# Patient Record
Sex: Female | Born: 2000 | Race: Black or African American | Hispanic: Yes | Marital: Single | Smoking: Never smoker
Health system: Southern US, Community
[De-identification: ages and names within clinical notes are randomized; demographics above are authoritative.]

## PROBLEM LIST (undated history)

## (undated) DIAGNOSIS — R011 Cardiac murmur, unspecified: Secondary | ICD-10-CM

## (undated) DIAGNOSIS — I10 Essential (primary) hypertension: Secondary | ICD-10-CM

## (undated) HISTORY — PX: WISDOM TOOTH EXTRACTION: SHX21

## (undated) HISTORY — PX: OTHER SURGICAL HISTORY: SHX169

---
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
## 2019-10-27 ENCOUNTER — Ambulatory Visit: Payer: BC Managed Care – PPO | Attending: Internal Medicine

## 2019-10-27 ENCOUNTER — Other Ambulatory Visit: Payer: Self-pay

## 2019-10-27 DIAGNOSIS — Z20822 Contact with and (suspected) exposure to covid-19: Secondary | ICD-10-CM
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
## 2019-10-30 ENCOUNTER — Telehealth: Payer: Self-pay

## 2019-10-30 LAB — NOVEL CORONAVIRUS, NAA: SARS-CoV-2, NAA: NOT DETECTED

## 2019-10-30 NOTE — Telephone Encounter (Signed)
Patient needs her negative COVID-19 result for school but realized it does not have her name when she views in My Chart.  She was informed that we can fax to her school.  She will check with school to make sure they will accept via fax and call back.

## 2020-01-10 ENCOUNTER — Ambulatory Visit: Payer: BC Managed Care – PPO | Attending: Internal Medicine

## 2020-01-10 DIAGNOSIS — Z20822 Contact with and (suspected) exposure to covid-19: Secondary | ICD-10-CM

## 2020-01-11 LAB — SARS-COV-2, NAA 2 DAY TAT

## 2020-01-11 LAB — NOVEL CORONAVIRUS, NAA: SARS-CoV-2, NAA: NOT DETECTED

## 2020-12-10 LAB — LAB REPORT - SCANNED: Chlamydia, Swab/Urine, PCR: NEGATIVE

## 2021-03-01 ENCOUNTER — Ambulatory Visit
Admission: EM | Admit: 2021-03-01 | Discharge: 2021-03-01 | Disposition: A | Discharge status: Home / Self Care | Payer: BC Managed Care – PPO | Attending: Emergency Medicine | Admitting: Emergency Medicine

## 2021-03-01 ENCOUNTER — Other Ambulatory Visit: Payer: Self-pay

## 2021-03-01 ENCOUNTER — Encounter: Payer: Self-pay | Admitting: Emergency Medicine

## 2021-03-01 DIAGNOSIS — J069 Acute upper respiratory infection, unspecified: Secondary | ICD-10-CM

## 2021-03-01 DIAGNOSIS — Z1152 Encounter for screening for COVID-19: Secondary | ICD-10-CM | POA: Diagnosis not present

## 2021-03-01 MED ORDER — IBUPROFEN 800 MG PO TABS: 800.0000 mg | ORAL_TABLET | Freq: Three times a day (TID) | ORAL | 0 refills | Status: DC

## 2021-03-01 MED ORDER — CETIRIZINE HCL 10 MG PO CAPS: 10.0000 mg | ORAL_CAPSULE | Freq: Every day | ORAL | 0 refills | Status: DC

## 2021-03-01 MED ORDER — BENZONATATE 200 MG PO CAPS: 200.0000 mg | ORAL_CAPSULE | Freq: Three times a day (TID) | ORAL | 0 refills | Status: AC | PRN

## 2021-03-01 NOTE — ED Triage Notes (Signed)
Pt said x 4 days has been having nasal congestion, with itchy throat with no appetite and body aches with central chest pain that goes up into he left breast, pain is more with deep breaths and sometimes come and goes. No fevers, no chills

## 2021-03-01 NOTE — Discharge Instructions (Signed)
COVID test pending, monitor MyChart for results Tylenol and ibuprofen for fevers, sore throat, body aches and chest discomfort Rest and fluids Tessalon every 8 hours for cough Begin daily cetirizine/Zyrtec to help with postnasal drainage/throat irritation May continue to use over-the-counter medicine if preferred Follow-up if not improving or worsening

## 2021-03-01 NOTE — ED Provider Notes (Signed)
[facility]    CSN: [id number] Arrival date & time: 03/01/21  1047      History   Chief Complaint Chief Complaint  Patient presents with  . Nasal Congestion  . Sore Throat  . Generalized Body Aches    HPI Katherine Shaffer is a 20 y.o. female presenting today for evaluation of URI symptoms.  Reports over the past 4 days has had congestion, throat irritation as well as some body aches and decreased appetite.  Denies central chest discomfort into the left side and under left breast.  Worse with deep breaths.  Reports pain is intermittent.  Denies any fevers or chills  HPI  Past Medical History:  Diagnosis Date  . Heart murmur     There are no problems to display for this patient.   History reviewed. No pertinent surgical history.  OB History   No obstetric history on file.      Home Medications    Prior to Admission medications   Medication Sig Start Date End Date Taking? Authorizing Provider  benzonatate (TESSALON) 200 MG capsule Take 1 capsule (200 mg total) by mouth 3 (three) times daily as needed for up to 7 days for cough. 03/01/21 03/08/21 Yes Malin Cervini C, PA-C  Cetirizine HCl 10 MG CAPS Take 1 capsule (10 mg total) by mouth daily for 10 days. 03/01/21 03/11/21 Yes Lorien Shingler C, PA-C  ibuprofen (ADVIL) 800 MG tablet Take 1 tablet (800 mg total) by mouth 3 (three) times daily. 03/01/21  Yes Cataleia Gade C, PA-C  amoxicillin (AMOXIL) 500 MG capsule Take 2 capsules (1,000 mg total) by mouth 2 (two) times daily. 11/28/17   Ofilia Neas, PA-C  norgestimate-ethinyl estradiol (ORTHO-CYCLEN,SPRINTEC,PREVIFEM) 0.25-35 MG-MCG tablet Take 1 tablet by mouth daily.    [provider]  traMADol (ULTRAM) 50 MG tablet Take 1 tablet (50 mg total) by mouth every 6 (six) hours as needed for severe pain. 11/12/18   Charlestine Night, PA-C    Family History Family History  Problem Relation Age of Onset  . Hypertension Mother     Social  History Social History   Tobacco Use  . Smoking status: Never Smoker  Substance Use Topics  . Alcohol use: No  . Drug use: No     Allergies   Patient has no known allergies.   Review of Systems Review of Systems  Constitutional: Negative for activity change, appetite change, chills, fatigue and fever.  HENT: Positive for congestion, rhinorrhea, sinus pressure and sore throat. Negative for ear pain and trouble swallowing.   Eyes: Negative for discharge and redness.  Respiratory: Positive for cough and chest tightness. Negative for shortness of breath.   Cardiovascular: Negative for chest pain.  Gastrointestinal: Negative for abdominal pain, diarrhea, nausea and vomiting.  Musculoskeletal: Negative for myalgias.  Skin: Negative for rash.  Neurological: Positive for headaches. Negative for dizziness and light-headedness.     Physical Exam Triage Vital Signs ED Triage Vitals  Enc Vitals Group     BP 03/01/21 1312 124/76     Pulse Rate 03/01/21 1312 88     Resp 03/01/21 1312 16     Temp 03/01/21 1312 98.9 F (37.2 C)     Temp Source 03/01/21 1312 Oral     SpO2 03/01/21 1312 97 %     Weight --      Height --      Head Circumference --      Peak Flow --  Pain Score 03/01/21 1310 2     Pain Loc --      Pain Edu? --      Excl. in GC? --    No data found.  Updated Vital Signs BP 124/76 (BP Location: Right Arm)   Pulse 88   Temp 98.9 F (37.2 C) (Oral)   Resp 16   SpO2 97%   Visual Acuity Right Eye Distance:   Left Eye Distance:   Bilateral Distance:    Right Eye Near:   Left Eye Near:    Bilateral Near:     Physical Exam Vitals and nursing note reviewed.  Constitutional:      Appearance: She is well-developed.     Comments: No acute distress  HENT:     Head: Normocephalic and atraumatic.     Ears:     Comments: Bilateral ears without tenderness to palpation of external auricle, tragus and mastoid, EAC's without erythema or swelling, TM's with good  bony landmarks and cone of light. Non erythematous.     Nose: Nose normal.     Mouth/Throat:     Comments: Oral mucosa pink and moist, no tonsillar enlargement or exudate. Posterior pharynx patent and nonerythematous, no uvula deviation or swelling. Normal phonation. Eyes:     Conjunctiva/sclera: Conjunctivae normal.  Cardiovascular:     Rate and Rhythm: Normal rate.  Pulmonary:     Effort: Pulmonary effort is normal. No respiratory distress.     Comments: Breathing comfortably at rest, CTABL, no wheezing, rales or other adventitious sounds auscultated Abdominal:     General: There is no distension.  Musculoskeletal:        General: Normal range of motion.     Cervical back: Neck supple.  Skin:    General: Skin is warm and dry.  Neurological:     Mental Status: She is alert and oriented to person, place, and time.      UC Treatments / Results  Labs (all labs ordered are listed, but only abnormal results are displayed) Labs Reviewed  NOVEL CORONAVIRUS, NAA    EKG   Radiology No results found.  Procedures Procedures (including critical care time)  Medications Ordered in UC Medications - No data to display  Initial Impression / Assessment and Plan / UC Course  I have reviewed the triage vital signs and the nursing notes.  Pertinent labs & imaging results that were available during my care of the patient were reviewed by me and considered in my medical decision making (see chart for details).     Viral URI with cough-COVID test pending, exam reassuring, recommending symptomatic and supportive care rest and fluids.  Discussed strict return precautions. Patient verbalized understanding and is agreeable with plan.  Final Clinical Impressions(s) / UC Diagnoses   Final diagnoses:  Encounter for screening for COVID-19  Viral URI with cough     Discharge Instructions     COVID test pending, monitor MyChart for results Tylenol and ibuprofen for fevers, sore  throat, body aches and chest discomfort Rest and fluids Tessalon every 8 hours for cough Begin daily cetirizine/Zyrtec to help with postnasal drainage/throat irritation May continue to use over-the-counter medicine if preferred Follow-up if not improving or worsening    ED Prescriptions    Medication Sig Dispense Auth. Provider   ibuprofen (ADVIL) 800 MG tablet Take 1 tablet (800 mg total) by mouth 3 (three) times daily. 21 tablet Yalitza Teed C, PA-C   Cetirizine HCl 10 MG CAPS Take 1 capsule (  10 mg total) by mouth daily for 10 days. 10 capsule Leigha Olberding C, PA-C   benzonatate (TESSALON) 200 MG capsule Take 1 capsule (200 mg total) by mouth 3 (three) times daily as needed for up to 7 days for cough. 28 capsule Cherilyn Sautter, [place] C, PA-C     PDMP not reviewed this encounter.   Lew Dawes, PA-C 03/01/21 1443

## 2021-03-03 LAB — SARS-COV-2, NAA 2 DAY TAT

## 2021-03-03 LAB — NOVEL CORONAVIRUS, NAA: SARS-CoV-2, NAA: NOT DETECTED

## 2022-04-29 ENCOUNTER — Ambulatory Visit (INDEPENDENT_AMBULATORY_CARE_PROVIDER_SITE_OTHER): Payer: 59 | Admitting: Podiatry

## 2022-04-29 ENCOUNTER — Ambulatory Visit (INDEPENDENT_AMBULATORY_CARE_PROVIDER_SITE_OTHER): Payer: 59

## 2022-04-29 ENCOUNTER — Encounter: Payer: Self-pay | Admitting: Podiatry

## 2022-04-29 ENCOUNTER — Ambulatory Visit: Payer: 59

## 2022-04-29 DIAGNOSIS — M2042 Other hammer toe(s) (acquired), left foot: Secondary | ICD-10-CM

## 2022-04-29 DIAGNOSIS — M2041 Other hammer toe(s) (acquired), right foot: Secondary | ICD-10-CM

## 2022-04-29 NOTE — Progress Notes (Signed)
  Subjective:  Patient ID: Katherine Shaffer, female    DOB: Aug 13, 2001,   MRN: [id number]  Chief Complaint  Patient presents with   Hammer Toe    bil foot pain/ possilbe hammertoe/bunion     21 y.o. female presents for concern of bilateral second toe pain Relates throbbing pain in the toe. Has tried wearing larger shoes without relief. She would like to prevent the toe from shortening and hammering. Relates the pain does vary. She is not diabetic.  Marland Kitchen Denies any other pedal complaints. Denies n/v/f/c.   Past Medical History:  Diagnosis Date   Heart murmur     Objective:  Physical Exam: Vascular: DP/PT pulses 2/4 bilateral. CFT <3 seconds. Normal hair growth on digits. No edema.  Skin. No lacerations or abrasions bilateral feet.  Musculoskeletal: MMT 5/5 bilateral lower extremities in DF, PF, Inversion and Eversion. Deceased ROM in DF of ankle joint.  Elongated second digit bilateral with some hammered at the PIPJ but mostly straight and flexible.  Neurological: Sensation intact to light touch.   Assessment:   1. Pain in both feet      Plan:  Patient was evaluated and treated and all questions answered. -X-rays reviewed. Second digit elongated mostly from the proximal phalanx.  -Educated on hammertoes and treatment options  -Discussed padding including toe caps and crest pads.  -Discussed need for potential surgery if pain does not improved.  -Patient to follow-up as needed. Discussed calling if any changes or increased pain.    Louann Sjogren, DPM

## 2022-05-27 ENCOUNTER — Encounter: Payer: Self-pay | Admitting: Nurse Practitioner

## 2022-05-27 ENCOUNTER — Ambulatory Visit (INDEPENDENT_AMBULATORY_CARE_PROVIDER_SITE_OTHER): Payer: 59 | Admitting: Nurse Practitioner

## 2022-05-27 VITALS — BP 118/80 | HR 86 | Temp 97.4°F | Ht 64.0 in | Wt 157.6 lb

## 2022-05-27 DIAGNOSIS — B354 Tinea corporis: Secondary | ICD-10-CM

## 2022-05-27 DIAGNOSIS — Z0001 Encounter for general adult medical examination with abnormal findings: Secondary | ICD-10-CM

## 2022-05-27 DIAGNOSIS — Z23 Encounter for immunization: Secondary | ICD-10-CM | POA: Diagnosis not present

## 2022-05-27 DIAGNOSIS — Z111 Encounter for screening for respiratory tuberculosis: Secondary | ICD-10-CM

## 2022-05-27 LAB — COMPREHENSIVE METABOLIC PANEL
ALT: 14 U/L (ref 0–35)
AST: 18 U/L (ref 0–37)
Albumin: 4.5 g/dL (ref 3.5–5.2)
Alkaline Phosphatase: 60 U/L (ref 39–117)
BUN: 14 mg/dL (ref 6–23)
CO2: 23 mEq/L (ref 19–32)
Calcium: 9.4 mg/dL (ref 8.4–10.5)
Chloride: 102 mEq/L (ref 96–112)
Creatinine, Ser: 0.81 mg/dL (ref 0.40–1.20)
GFR: 103.22 mL/min (ref >60.00)
Glucose, Bld: 77 mg/dL (ref 70–99)
Potassium: 4 mEq/L (ref 3.5–5.1)
Sodium: 139 mEq/L (ref 135–145)
Total Bilirubin: 0.4 mg/dL (ref 0.2–1.2)
Total Protein: 7.7 g/dL (ref 6.0–8.3)

## 2022-05-27 LAB — CBC
HCT: 37.6 % (ref 36.0–46.0)
Hemoglobin: 12.6 g/dL (ref 12.0–15.0)
MCHC: 33.4 g/dL (ref 30.0–36.0)
MCV: 85.7 fl (ref 78.0–100.0)
Platelets: 361 10*3/uL (ref 150.0–400.0)
RBC: 4.39 Mil/uL (ref 3.87–5.11)
RDW: 13.8 % (ref 11.5–15.5)
WBC: 9.7 10*3/uL (ref 4.0–10.5)

## 2022-05-27 LAB — TSH: TSH: 1.19 u[IU]/mL (ref 0.35–5.50)

## 2022-05-27 MED ORDER — KETOCONAZOLE 2 % EX CREA: 1.0000 | TOPICAL_CREAM | Freq: Every day | CUTANEOUS | 0 refills | Status: DC

## 2022-05-27 NOTE — Patient Instructions (Addendum)
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  Schedule appt with GYN for breast and pelvic exam. Go to lab Thank you for choosing [facility].  Body Ringworm Body ringworm is an infection of the skin that often causes a ring-shaped rash. Body ringworm is also called tinea corporis. Body ringworm can affect any part of your skin. This condition is easily spread from person to person (is very contagious). What are the causes? This condition is caused by fungi called dermatophytes. The condition develops when these fungi grow out of control on the skin. You can get this condition if you touch a person or animal that has it. You can also get it if you share any items with an infected person or pet. These include: Clothing, bedding, and towels. Brushes or combs. Gym equipment. Any other object that has the fungus on it. What increases the risk? You are more likely to develop this condition if you: Play sports that involve close physical contact, such as wrestling. Sweat a lot. Live in areas that are hot and humid. Use public showers. Have a weakened immune system. What are the signs or symptoms? Symptoms of this condition include: Itchy, raised red spots and bumps. Red scaly patches. A ring-shaped rash. The rash may have: A clear center. Scales or red bumps at its center. Redness near its borders. Dry and scaly skin on or around it. How is this diagnosed? This condition can usually be diagnosed with a skin exam. A skin scraping may be taken from the affected area and examined under a microscope to see if the fungus is present. How is this treated? This condition may be treated with: An antifungal cream or ointment. An antifungal shampoo. Antifungal medicines. These may be prescribed if your ringworm: Is severe. Keeps coming back. Lasts a long time. Follow these instructions at home: Take over-the-counter and  prescription medicines only as told by your health care provider. If you were given an antifungal cream or ointment: Use it as told by your health care provider. Wash the infected area and dry it completely before applying the cream or ointment. If you were given an antifungal shampoo: Use it as told by your health care provider. Leave the shampoo on your body for 3-5 minutes before rinsing. While you have a rash: Wear loose clothing to stop clothes from rubbing and irritating it. Wash or change your bed sheets every night. Disinfect or throw out items that may be infected. Wash clothes and bed sheets in hot water. Wash your hands often with soap and water. If soap and water are not available, use hand sanitizer. If your pet has the same infection, take your pet to see a veterinarian for treatment. How is this prevented? Take a bath or shower every day and after every time you work out or play sports. Dry your skin completely after bathing. Wear sandals or shoes in public places and showers. Change your clothes every day. Wash athletic clothes after each use. Do not share personal items with others. Avoid touching red patches of skin on other people. Avoid touching pets that have bald spots. If you touch an animal that has a bald spot, wash your hands. Contact a health care provider if: Your rash continues to spread after 7 days of treatment. Your rash is not gone in 4 weeks. The area around your rash gets red, warm, tender, and swollen. Summary Body ringworm is an infection of the skin  that often causes a ring-shaped rash. This condition is easily spread from person to person (is very contagious). This condition may be treated with antifungal cream or ointment, antifungal shampoo, or antifungal medicines. Take over-the-counter and prescription medicines only as told by your health care provider. This information is not intended to replace advice given to you by your health care  provider. Make sure you discuss any questions you have with your health care provider. Document Revised: 12/17/2021 Document Reviewed: 07/29/2021 Elsevier Patient Education  2023 [employer].

## 2022-05-27 NOTE — Progress Notes (Signed)
Complete physical exam  Patient: Katherine Shaffer   DOB: 2001-10-10   21 y.o. Female  MRN: [id number] Visit Date: 05/27/2022  Subjective:    Chief Complaint  Patient presents with   Establish Care    New Pt, est care  Needs form filled out for work , pt not fasting Possible referral for rash on left side of hip No other concerns  HPV & tdap given today    Katherine Shaffer is a 21 y.o. female who presents today for a complete physical exam. She reports consuming a general diet.  none  She generally feels well. She reports sleeping well. She does have additional problems to discuss today.  Vision:Yes Dental:Within Last 6 months STD Screen:No  GYN: [facility]. Will schedule No contraception.  Most recent fall risk assessment:     No data to display           Most recent depression screenings:    05/27/2022   11:40 AM  PHQ 2/9 Scores  PHQ - 2 Score 0  PHQ- 9 Score 5   Rash This is a new problem. The current episode started more than 1 month ago. The problem is unchanged. The affected locations include the abdomen. The rash is characterized by dryness and itchiness. She was exposed to nothing. Pertinent negatives include no congestion, cough, diarrhea, fatigue, fever, joint pain, shortness of breath or sore throat. Past treatments include anti-itch cream (clotrimazole). The treatment provided mild relief. There is no history of eczema or varicella.    Past Medical History:  Diagnosis Date   Heart murmur    History reviewed. No pertinent surgical history. Social History   Socioeconomic History   Marital status: Single    Spouse name: Not on file   Number of children: Not on file   Years of education: Not on file   Highest education level: Not on file  Occupational History   Not on file  Tobacco Use   Smoking status: Never   Smokeless tobacco: Not on file  Vaping Use   Vaping Use: Never used  Substance and Sexual Activity   Alcohol use: No    Drug use: No   Sexual activity: Yes    Birth control/protection: None  Other Topics Concern   Not on file  Social History Narrative   Not on file   Social Determinants of Health   Financial Resource Strain: Not on file  Food Insecurity: Not on file  Transportation Needs: Not on file  Physical Activity: Not on file  Stress: Not on file  Social Connections: Not on file  Intimate Partner Violence: Not on file   Family Status  Relation Name Status   Mother  Alive   Father  Alive   MGM  (Not Specified)   MGF  Deceased   PGM  Alive   Family History  Problem Relation Age of Onset   Hypertension Mother    Glaucoma Mother    Diabetes Maternal Grandmother    Stroke Maternal Grandfather 50   Dementia Maternal Grandfather    Glaucoma Paternal Grandmother    No Known Allergies  Patient Care Team: Bronislaw Switzer, Bonna Gains, NP as PCP - [facility], [facility]   Medications: Outpatient Medications Prior to Visit  Medication Sig   [DISCONTINUED] amoxicillin (AMOXIL) 500 MG capsule Take 2 capsules (1,000 mg total) by mouth 2 (two) times daily.   [DISCONTINUED] Cetirizine HCl 10 MG CAPS Take 1 capsule (10 mg total)  by mouth daily for 10 days.   [DISCONTINUED] ibuprofen (ADVIL) 800 MG tablet Take 1 tablet (800 mg total) by mouth 3 (three) times daily.   [DISCONTINUED] norgestimate-ethinyl estradiol (ORTHO-CYCLEN,SPRINTEC,PREVIFEM) 0.25-35 MG-MCG tablet Take 1 tablet by mouth daily.   [DISCONTINUED] traMADol (ULTRAM) 50 MG tablet Take 1 tablet (50 mg total) by mouth every 6 (six) hours as needed for severe pain.   No facility-administered medications prior to visit.    Review of Systems  Constitutional:  Negative for fatigue and fever.  HENT:  Negative for congestion and sore throat.   Eyes:        Negative for visual changes  Respiratory:  Negative for cough and shortness of breath.   Cardiovascular:  Negative for chest pain, palpitations and leg  swelling.  Gastrointestinal:  Negative for blood in stool, constipation and diarrhea.  Genitourinary:  Negative for dysuria, frequency and urgency.  Musculoskeletal:  Negative for joint pain and myalgias.  Skin:  Positive for rash.  Neurological:  Negative for dizziness and headaches.  Hematological:  Does not bruise/bleed easily.  Psychiatric/Behavioral:  Negative for suicidal ideas. The patient is not nervous/anxious.         Objective:  BP 118/80 (BP Location: Right Arm, Patient Position: Sitting, Cuff Size: Small)   Pulse 86   Temp (!) 97.4 F (36.3 C) (Temporal)   Ht 5\' 4"  (1.626 m)   Wt 157 lb 9.6 oz (71.5 kg)   SpO2 99%   BMI 27.05 kg/m       Physical Exam Constitutional:      General: She is not in acute distress. HENT:     Right Ear: Tympanic membrane, ear canal and external ear normal.     Left Ear: Tympanic membrane, ear canal and external ear normal.     Nose: Nose normal.  Eyes:     General: No scleral icterus.    Extraocular Movements: Extraocular movements intact.     Conjunctiva/sclera: Conjunctivae normal.  Cardiovascular:     Rate and Rhythm: Normal rate and regular rhythm.     Pulses: Normal pulses.     Heart sounds: Normal heart sounds.  Pulmonary:     Effort: Pulmonary effort is normal. No respiratory distress.     Breath sounds: Normal breath sounds.  Abdominal:     General: Bowel sounds are normal. There is no distension.     Palpations: Abdomen is soft.  Genitourinary:    Comments: Deferred breast and pelvic exam to GYN per patient Musculoskeletal:        General: Normal range of motion.     Cervical back: Normal range of motion and neck supple.  Lymphadenopathy:     Cervical: No cervical adenopathy.  Skin:    General: Skin is warm and dry.     Findings: Rash present. No erythema. Rash is macular.       Neurological:     Mental Status: She is alert and oriented to person, place, and time.  Psychiatric:        Mood and Affect: Mood  normal.        Behavior: Behavior normal.        Thought Content: Thought content normal.     No results found for any visits on 05/27/22.    Assessment & Plan:    Routine Health Maintenance and Physical Exam  Immunization History  Administered Date(s) Administered   HPV 9-valent 05/27/2022   Tdap 05/27/2022   Health Maintenance  Topic Date Due  HIV Screening  Never done   Hepatitis C Screening  Never done   PAP-Cervical Cytology Screening  Never done   PAP SMEAR-Modifier  Never done   INFLUENZA VACCINE  05/19/2022   HPV VACCINES (2 - 3-dose series) 06/24/2022   TETANUS/TDAP  05/27/2032   Discussed health benefits of physical activity, and encouraged her to engage in regular exercise appropriate for her age and condition.  Problem List Items Addressed This Visit   None Visit Diagnoses     Encounter for preventative adult health care exam with abnormal findings    -  Primary   Relevant Orders   CBC   Comprehensive metabolic panel   TSH   Tinea corporis       Relevant Medications   ketoconazole (NIZORAL) 2 % cream   Screening-pulmonary TB       Relevant Orders   QuantiFERON-TB Gold Plus   Need for Tdap vaccination       Relevant Orders   Tdap vaccine greater than or equal to 7yo IM (Completed)   Need for HPV vaccination       Relevant Orders   HPV 9-valent vaccine,Recombinat (Completed)      Return in about 1 year (around 05/28/2023) for CPE (fasting).     Alysia Penna, NP

## 2022-05-29 LAB — QUANTIFERON-TB GOLD PLUS
Mitogen-NIL: >10 IU/mL
NIL: 0.1 IU/mL
QuantiFERON-TB Gold Plus: NEGATIVE
TB1-NIL: 0.08 IU/mL
TB2-NIL: 0.01 IU/mL

## 2022-06-11 ENCOUNTER — Ambulatory Visit (INDEPENDENT_AMBULATORY_CARE_PROVIDER_SITE_OTHER): Payer: 59 | Admitting: Family Medicine

## 2022-06-11 ENCOUNTER — Other Ambulatory Visit: Payer: Self-pay

## 2022-06-11 DIAGNOSIS — R109 Unspecified abdominal pain: Secondary | ICD-10-CM

## 2022-06-11 DIAGNOSIS — B49 Unspecified mycosis: Secondary | ICD-10-CM | POA: Diagnosis not present

## 2022-06-11 DIAGNOSIS — O26899 Other specified pregnancy related conditions, unspecified trimester: Secondary | ICD-10-CM

## 2022-06-11 DIAGNOSIS — N912 Amenorrhea, unspecified: Secondary | ICD-10-CM

## 2022-06-11 LAB — POCT PREGNANCY, URINE: Preg Test, Ur: POSITIVE — AB

## 2022-06-11 NOTE — Patient Instructions (Signed)
Prenatal Care Providers           [facility]  [address] [phone]  [facility]   [address]  [phone]  [facility] [place]       [address] [phone]            [facility] [place]     [address] [phone]          [facility] [place]   [address] [phone]  [facility]  [address] [phone]     [facility]  [address]   [phone]     [facility]  Phone: [phone]  [facility]  Phone: [phone]  [facility] Phone: [phone]  [facility] Phone: [phone]  [facility] Phone: [phone]  [facility] Phone: [phone]  [facility]  Phone: [phone]  

## 2022-06-11 NOTE — Progress Notes (Signed)
  Amenorrhea with positive UPT PROBLEM  VISIT ENCOUNTER NOTE  Subjective:   Katherine Shaffer is a 21 y.o. [place] female here for positive pregnancy test.  Patient is [redacted]w[redacted]d by LMP. She has not had serial bhcg. She has not had an [place]. She does report an abnormal period so dating is unsure.   Denies abnormal vaginal bleeding, discharge, pelvic pain, problems with intercourse or other gynecologic concerns.    Gynecologic History No LMP recorded. (Menstrual status: Oral contraceptives).  Health Maintenance Due  Topic Date Due   PAP-Cervical Cytology Screening  Never done   PAP SMEAR-Modifier  Never done   INFLUENZA VACCINE  05/19/2022   HPV VACCINES (2 - 3-dose series) 06/24/2022    The following portions of the patient's history were reviewed and updated as appropriate: allergies, current medications, past family history, past medical history, past social history, past surgical history and problem list.  Review of Systems Pertinent items are noted in HPI.   Objective:  There were no vitals taken for this visit. Gen: well appearing, NAD HEENT: no scleral icterus CV: RR Lung: Normal WOB Ext: warm well perfused   Assessment and Plan:   1. Amenorrhea UPT pos Plan for dating [place]  2. 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 Rip Harbour to continue topical treatment  3. Cramping complicating pregnancy, antepartum Reviewed normality of this complain Discussed use of tylenol if needed but if worsening to go to the hospital   Please refer to After Visit Summary for other counseling recommendations.   Return in about 4 weeks (around 07/09/2022) for New OB visit.  Future Appointments  Date Time Provider Department Center  06/23/2022 11:00 AM [facility] US2 [facility]     Federico Flake, MD, MPH, ABFM Attending Physician Faculty Practice- Center for [facility]

## 2022-06-11 NOTE — Progress Notes (Signed)
Here for pregnancy test which was positive.  She reports last period was 05/01/22 but was not like her normal periods. States this period was very light and only lasted 2 days whereas her normal period is 5-7 days with heavy bleeding. Dating [place] offered and accepted and scheduled for 06/23/22.  Informed her we recommend she start prenatal care once EDD determined . I also recommended she start prenatal vitamins. She plans to take OTC prenatal gummies. She states she would like to get prenatal care here. I offered prenatal care choices and she choosed [facility]. I informed her I will message [facility] coordinator and they will make appointments once EDD determined unless they are full.  Dr. Alvester Morin in to welcome her to practice and answer questions. Nancy Fetter

## 2022-06-23 ENCOUNTER — Other Ambulatory Visit: Payer: Self-pay | Admitting: General Practice

## 2022-06-23 ENCOUNTER — Ambulatory Visit (INDEPENDENT_AMBULATORY_CARE_PROVIDER_SITE_OTHER): Payer: Self-pay

## 2022-06-23 DIAGNOSIS — Z3491 Encounter for supervision of normal pregnancy, unspecified, first trimester: Secondary | ICD-10-CM

## 2022-06-23 DIAGNOSIS — O3680X0 Pregnancy with inconclusive fetal viability, not applicable or unspecified: Secondary | ICD-10-CM

## 2022-06-23 NOTE — Progress Notes (Signed)
   GYNECOLOGY OFFICE VISIT NOTE  History:   Katherine Shaffer is a 21 y.o. G1P0 here today for ultrasound results.  Ultrasound shows gestational sac with 2 possible yolk sacs but no fetal poles yet.  Abdominal pain: No   Vaginal bleeding: No    OB History  Gravida Para Term Preterm AB Living  1            SAB IAB Ectopic Multiple Live Births               # Outcome Date GA Lbr Len/2nd Weight Sex Delivery Anes PTL Lv  1 Gravida              Health Maintenance Due  Topic Date Due   PAP-Cervical Cytology Screening  Never done   PAP SMEAR-Modifier  Never done   INFLUENZA VACCINE  Never done   HPV VACCINES (2 - 3-dose series) 06/24/2022    Past Medical History:  Diagnosis Date   Heart murmur     No past surgical history on file.  The following portions of the patient's history were reviewed and updated as appropriate: allergies, current medications, past family history, past medical history, past social history, past surgical history and problem list.   Review of Systems:  Pertinent items noted in HPI and remainder of comprehensive ROS otherwise negative.  Physical Exam:   CONSTITUTIONAL: Well-developed, well-nourished female in no acute distress.  HEENT:  Normocephalic, atraumatic. External right and left ear normal. No scleral icterus.  NECK: Normal range of motion, supple, no masses noted on observation SKIN: No rash noted. Not diaphoretic. No erythema. No pallor. MUSCULOSKELETAL: Normal range of motion. No edema noted. NEUROLOGIC: Alert and oriented to person, place, and time. Normal muscle tone coordination.  PSYCHIATRIC: Normal mood and affect. Normal behavior. Normal judgment and thought content. RESPIRATORY: Effort normal, no problems with respiration noted  Assessment and Plan:  Pregnancy with uncertain viability - 036.80X0   -Reviewed results of ultrasound with patient and support people. Discussed 2 possible yolk sacs but no fetal poles meaning  possibility of twin gestation. Discussed need for repeat ultrasound in 2 weeks to determine viability. Reviewed possibility that repeat scan shows twins, singleton or non viable pregnancy. Appointment made for 9/19.   Please refer to After Visit Summary for other counseling recommendations.   Patient to follow up on 9/19 for repeat ultrasound  Total face-to-face time with patient: 30 minutes.  Over 50% of encounter was spent on counseling and coordination of care.   Rolm Bookbinder, CNM Center for [employer], [facility]

## 2022-06-30 ENCOUNTER — Inpatient Hospital Stay (HOSPITAL_COMMUNITY)
Admission: AD | Admit: 2022-06-30 | Discharge: 2022-06-30 | Disposition: A | Discharge status: Home / Self Care | Payer: BC Managed Care – PPO | Attending: Obstetrics & Gynecology | Admitting: Obstetrics & Gynecology

## 2022-06-30 ENCOUNTER — Inpatient Hospital Stay (HOSPITAL_COMMUNITY): Payer: BC Managed Care – PPO

## 2022-06-30 ENCOUNTER — Other Ambulatory Visit: Payer: Self-pay

## 2022-06-30 ENCOUNTER — Encounter (HOSPITAL_COMMUNITY): Payer: Self-pay

## 2022-06-30 DIAGNOSIS — Z3491 Encounter for supervision of normal pregnancy, unspecified, first trimester: Secondary | ICD-10-CM

## 2022-06-30 DIAGNOSIS — Z3A08 8 weeks gestation of pregnancy: Secondary | ICD-10-CM | POA: Diagnosis not present

## 2022-06-30 DIAGNOSIS — O418X10 Other specified disorders of amniotic fluid and membranes, first trimester, not applicable or unspecified: Secondary | ICD-10-CM | POA: Diagnosis not present

## 2022-06-30 DIAGNOSIS — O208 Other hemorrhage in early pregnancy: Secondary | ICD-10-CM | POA: Diagnosis not present

## 2022-06-30 DIAGNOSIS — R1030 Lower abdominal pain, unspecified: Secondary | ICD-10-CM | POA: Insufficient documentation

## 2022-06-30 DIAGNOSIS — Z3A01 Less than 8 weeks gestation of pregnancy: Secondary | ICD-10-CM | POA: Diagnosis not present

## 2022-06-30 DIAGNOSIS — O26891 Other specified pregnancy related conditions, first trimester: Secondary | ICD-10-CM | POA: Diagnosis present

## 2022-06-30 LAB — CBC WITH DIFFERENTIAL/PLATELET
Abs Immature Granulocytes: 0.05 10*3/uL (ref 0.00–0.07)
Basophils Absolute: 0 10*3/uL (ref 0.0–0.1)
Basophils Relative: 0 %
Eosinophils Absolute: 0.3 10*3/uL (ref 0.0–0.5)
Eosinophils Relative: 3 %
HCT: 34.2 % — ABNORMAL LOW (ref 36.0–46.0)
Hemoglobin: 11.8 g/dL — ABNORMAL LOW (ref 12.0–15.0)
Immature Granulocytes: 1 %
Lymphocytes Relative: 28 %
Lymphs Abs: 2.7 10*3/uL (ref 0.7–4.0)
MCH: 28.9 pg (ref 26.0–34.0)
MCHC: 34.5 g/dL (ref 30.0–36.0)
MCV: 83.8 fL (ref 80.0–100.0)
Monocytes Absolute: 0.7 10*3/uL (ref 0.1–1.0)
Monocytes Relative: 7 %
Neutro Abs: 6.1 10*3/uL (ref 1.7–7.7)
Neutrophils Relative %: 61 %
Platelets: 326 10*3/uL (ref 150–400)
RBC: 4.08 MIL/uL (ref 3.87–5.11)
RDW: 13.4 % (ref 11.5–15.5)
WBC: 9.8 10*3/uL (ref 4.0–10.5)
nRBC: 0 % (ref 0.0–0.2)

## 2022-06-30 LAB — URINALYSIS, ROUTINE W REFLEX MICROSCOPIC
Bilirubin Urine: NEGATIVE
Glucose, UA: NEGATIVE mg/dL
Hgb urine dipstick: NEGATIVE
Ketones, ur: NEGATIVE mg/dL
Leukocytes,Ua: NEGATIVE
Nitrite: NEGATIVE
Protein, ur: NEGATIVE mg/dL
Specific Gravity, Urine: 1.017 (ref 1.005–1.030)
pH: 7 (ref 5.0–8.0)

## 2022-06-30 NOTE — Discharge Instructions (Signed)

## 2022-06-30 NOTE — MAU Provider Note (Signed)
History     CSN: [id number]  Arrival date and time: 06/30/22 0818   Event Date/Time   First Provider Initiated Contact with Patient 06/30/22 [phone]      Chief Complaint  Patient presents with   Abdominal Pain   HPI  Katherine Shaffer is a 21 y.o. G1P0 at [redacted]w[redacted]d who presents for evaluation of lower abdominal pain. Patient reports last night she was having pain that was severe but has improved since last night. Patient rates the pain as a 5/10 and has not tried anything for the pain. She denies any vaginal bleeding, discharge, and leaking of fluid. Denies any constipation, diarrhea or any urinary complaints.   OB History     Gravida  1   Para      Term      Preterm      AB      Living         SAB      IAB      Ectopic      Multiple      Live Births              Past Medical History:  Diagnosis Date   Heart murmur     Past Surgical History:  Procedure Laterality Date   cyst on lip removed     WISDOM TOOTH EXTRACTION      Family History  Problem Relation Age of Onset   Hypertension Mother    Glaucoma Mother    Diabetes Maternal Grandmother    Stroke Maternal Grandfather 32   Dementia Maternal Grandfather    Glaucoma Paternal Grandmother     Social History   Tobacco Use   Smoking status: Never  Vaping Use   Vaping Use: Never used  Substance Use Topics   Alcohol use: No   Drug use: No    Allergies: No Known Allergies  No medications prior to admission.    Review of Systems  Constitutional: Negative.  Negative for fatigue and fever.  HENT: Negative.    Respiratory: Negative.  Negative for shortness of breath.   Cardiovascular: Negative.  Negative for chest pain.  Gastrointestinal:  Positive for abdominal pain. Negative for constipation, diarrhea, nausea and vomiting.  Genitourinary: Negative.  Negative for dysuria, vaginal bleeding and vaginal discharge.  Neurological: Negative.  Negative for dizziness and headaches.    Physical Exam   Blood pressure 110/68, pulse 84, temperature 99 F (37.2 C), temperature source Oral, resp. rate 16, height 5\' 5"  (1.651 m), weight 72 kg, last menstrual period 05/01/2022, SpO2 100 %.  Patient Vitals for the past 24 hrs:  BP Temp Temp src Pulse Resp SpO2 Height Weight  06/30/22 1129 110/68 -- -- 84 -- -- -- --  06/30/22 0844 (!) 122/54 99 F (37.2 C) Oral 85 16 100 % -- --  06/30/22 0838 -- -- -- -- -- -- 5\' 5"  (1.651 m) 72 kg    Physical Exam Vitals and nursing note reviewed.  Constitutional:      General: She is not in acute distress.    Appearance: She is well-developed.  HENT:     Head: Normocephalic.  Eyes:     Pupils: Pupils are equal, round, and reactive to light.  Cardiovascular:     Rate and Rhythm: Normal rate and regular rhythm.     Heart sounds: Normal heart sounds.  Pulmonary:     Effort: Pulmonary effort is normal. No respiratory distress.     Breath sounds: Normal  breath sounds.  Abdominal:     General: Bowel sounds are normal. There is no distension.     Palpations: Abdomen is soft.     Tenderness: There is no abdominal tenderness.  Skin:    General: Skin is warm and dry.  Neurological:     Mental Status: She is alert and oriented to person, place, and time.  Psychiatric:        Mood and Affect: Mood normal.        Behavior: Behavior normal.        Thought Content: Thought content normal.        Judgment: Judgment normal.     MAU Course  Procedures  Results for orders placed or performed during the hospital encounter of 06/30/22 (from the past 24 hour(s))  CBC with Differential/Platelet     Status: Abnormal   Collection Time: 06/30/22  9:17 AM  Result Value Ref Range   WBC 9.8 4.0 - 10.5 K/uL   RBC 4.08 3.87 - 5.11 MIL/uL   Hemoglobin 11.8 (L) 12.0 - 15.0 g/dL   HCT 86.7 (L) 67.2 - 09.4 %   MCV 83.8 80.0 - 100.0 fL   MCH 28.9 26.0 - 34.0 pg   MCHC 34.5 30.0 - 36.0 g/dL   RDW 70.9 62.8 - 36.6 %   Platelets 326 150 - 400  K/uL   nRBC 0.0 0.0 - 0.2 %   Neutrophils Relative % 61 %   Neutro Abs 6.1 1.7 - 7.7 K/uL   Lymphocytes Relative 28 %   Lymphs Abs 2.7 0.7 - 4.0 K/uL   Monocytes Relative 7 %   Monocytes Absolute 0.7 0.1 - 1.0 K/uL   Eosinophils Relative 3 %   Eosinophils Absolute 0.3 0.0 - 0.5 K/uL   Basophils Relative 0 %   Basophils Absolute 0.0 0.0 - 0.1 K/uL   Immature Granulocytes 1 %   Abs Immature Granulocytes 0.05 0.00 - 0.07 K/uL  Urinalysis, Routine w reflex microscopic Urine, Clean Catch     Status: Abnormal   Collection Time: 06/30/22  9:33 AM  Result Value Ref Range   Color, Urine YELLOW YELLOW   APPearance CLOUDY (A) CLEAR   Specific Gravity, Urine 1.017 1.005 - 1.030   pH 7.0 5.0 - 8.0   Glucose, UA NEGATIVE NEGATIVE mg/dL   Hgb urine dipstick NEGATIVE NEGATIVE   Bilirubin Urine NEGATIVE NEGATIVE   Ketones, ur NEGATIVE NEGATIVE mg/dL   Protein, ur NEGATIVE NEGATIVE mg/dL   Nitrite NEGATIVE NEGATIVE   Leukocytes,Ua NEGATIVE NEGATIVE     US OB Transvaginal  Result Date: 06/30/2022 CLINICAL DATA:  Abdominal pain EXAM: TRANSVAGINAL OB ULTRASOUND TECHNIQUE: Transvaginal ultrasound was performed for complete evaluation of the gestation as well as the maternal uterus, adnexal regions, and pelvic cul-de-sac. COMPARISON:  06/23/2022 FINDINGS: Intrauterine gestational sac: Single Yolk sac:  Seen measuring up to 4.2 mm Embryo:  Not seen Cardiac Activity: Not seen MSD: 18 mm   6 w   5 d Subchorionic hemorrhage: There is 1.2 x 0.3 cm subchorionic fluid collection. Maternal uterus/adnexae: Small amount of free fluid is seen in cul-de-sac. IMPRESSION: There is a gestational sac with slightly irregular shape sac within the uterus containing yolk sac. There is no definite demonstrable fetal pole or fetal cardiac activity. Findings suggest possible failed gestation with incomplete abortion. Please correlate with serial HCG measurements and consider short-term follow-up sonogram in 1-2 weeks. There is  linear fluid collection adjacent to the gestational sac suggesting subchorionic bleed. Small amount  of free fluid in pelvis may suggest recent rupture of ovarian cyst or follicle. Electronically Signed   By: Ernie Avena M.D.   On: 06/30/2022 09:53     MDM Labs ordered and reviewed.   UA CBC US OB transvaginal  CNM independently reviewed the imaging ordered. Imaging show new development of subchorionic hemorrhage  Reviewed results of subchorionic hemorrhage with patient and partner. Discussed that this is a common finding in the first trimester and does not usually cause problems in the pregnancy like loss or difficulty with development. Reviewed expectations for vaginal bleeding including a small amount possibly for several weeks. Reviewed warning signs of heavy bleeding, saturating a pad in less than an hour, and severe pain as reasons to come back to MAU. Encouraged patient to exercise pelvic rest until 7 days after bleeding stops. Patient and support person verbalized understanding.   Assessment and Plan   1. Normal intrauterine pregnancy on prenatal ultrasound in first trimester   2. Subchorionic hematoma in first trimester, single or unspecified fetus   3. [redacted] weeks gestation of pregnancy     -Discharge home in stable condition -First trimester precautions discussed -Patient advised to follow-up with OB as scheduled for repeat ultrasound -Patient may return to MAU as needed or if her condition were to change or worsen  Rolm Bookbinder, CNM 06/30/2022, 8:48 AM

## 2022-06-30 NOTE — MAU Note (Signed)
Katherine Shaffer is a 21 y.o. at [redacted]w[redacted]d here in MAU reporting: has been having mild cramping with the pregnancy but last night started having more severe lower abdominal pain. Today it is less painful and more isolated to the right side of her abdomen. No bleeding. Increased discharge but thinks it is just related to the pregnancy.  Onset of complaint: yesterday  Pain score: 5/10  Vitals:   06/30/22 0844  BP: (!) 122/54  Pulse: 85  Resp: 16  Temp: 99 F (37.2 C)  SpO2: 100%     FHT:n/a  Lab orders placed from triage: ua

## 2022-07-07 ENCOUNTER — Other Ambulatory Visit (INDEPENDENT_AMBULATORY_CARE_PROVIDER_SITE_OTHER): Payer: BC Managed Care – PPO

## 2022-07-07 ENCOUNTER — Encounter: Payer: Self-pay | Admitting: Advanced Practice Midwife

## 2022-07-07 ENCOUNTER — Ambulatory Visit (INDEPENDENT_AMBULATORY_CARE_PROVIDER_SITE_OTHER): Payer: BC Managed Care – PPO | Admitting: Advanced Practice Midwife

## 2022-07-07 ENCOUNTER — Telehealth: Payer: Self-pay | Admitting: Advanced Practice Midwife

## 2022-07-07 ENCOUNTER — Other Ambulatory Visit: Payer: Self-pay

## 2022-07-07 DIAGNOSIS — Q221 Congenital pulmonary valve stenosis: Secondary | ICD-10-CM

## 2022-07-07 DIAGNOSIS — O021 Missed abortion: Secondary | ICD-10-CM | POA: Insufficient documentation

## 2022-07-07 DIAGNOSIS — O3680X0 Pregnancy with inconclusive fetal viability, not applicable or unspecified: Secondary | ICD-10-CM | POA: Diagnosis not present

## 2022-07-07 NOTE — Progress Notes (Signed)
Ultrasounds Results Note  SUBJECTIVE HPI:  Ms. Katherine Shaffer is a 21 y.o. G1P0 at [redacted]w[redacted]d by LMP who presents to [facility] for followup ultrasound results. The patient denies/reports abdominal pain or vaginal bleeding.  Upon review of the patient's records, patient was first seen at [facility] for dating [place] on 06/23/22. Was seen in MAU for abd pain.   US OB Transvaginal  Result Date: 06/30/2022 CLINICAL DATA:  Abdominal pain EXAM: TRANSVAGINAL OB ULTRASOUND TECHNIQUE: Transvaginal ultrasound was performed for complete evaluation of the gestation as well as the maternal uterus, adnexal regions, and pelvic cul-de-sac. COMPARISON:  06/23/2022 FINDINGS: Intrauterine gestational sac: Single Yolk sac:  Seen measuring up to 4.2 mm Embryo:  Not seen Cardiac Activity: Not seen MSD: 18 mm   6 w   5 d Subchorionic hemorrhage: There is 1.2 x 0.3 cm subchorionic fluid collection. Maternal uterus/adnexae: Small amount of free fluid is seen in cul-de-sac. IMPRESSION: There is a gestational sac with slightly irregular shape sac within the uterus containing yolk sac. There is no definite demonstrable fetal pole or fetal cardiac activity. Findings suggest possible failed gestation with incomplete abortion. Please correlate with serial HCG measurements and consider short-term follow-up sonogram in 1-2 weeks. There is linear fluid collection adjacent to the gestational sac suggesting subchorionic bleed. Small amount of free fluid in pelvis may suggest recent rupture of ovarian cyst or follicle. Electronically Signed   By: Elmer Picker M.D.   On: 06/30/2022 09:53   US OB Transvaginal  Result Date: 06/26/2022 CLINICAL DATA:  21 yo 1/0 at  about 6 weeks by her LMP. Exam: OBSTETRIC <14 WK [place] and TRANSVAGINAL OB US Technique:  Transvaginal ultrasound examination was performed for complete evaluation of the gestation as well at the maternal uterus, adnexal regions, and pelvic cul-de-sac.   Transvaginal technique was performed to assess early pregnancy. Comparison:  None Findings: Retroverted uterus. IUP noted with gestational sac measuring about 7 weeks in size. Yolk sac: visualized, possible two visualized. Embryo: None Cervix: Long and closed Adnexa: Corpus luteum on the left side, otherwise normal bilaterally Subchorionic hemorrhage:  None Other findings:  Trace free fluid. 5 mm cystic structure is noted adjacent to the gestational sac within the uterine cavity. Impression: Intrauterine pregnancy, possible twin gestation. Uncertain viability. Recommendations: Recommend follow up ultrasound in 14 days to establish viability.   Repeat ultrasound was performed earlier today.   ABO Rh unknown    Past Medical History:  Diagnosis Date   Heart murmur    Past Surgical History:  Procedure Laterality Date   cyst on lip removed     WISDOM TOOTH EXTRACTION     Social History   Socioeconomic History   Marital status: Single    Spouse name: Not on file   Number of children: Not on file   Years of education: Not on file   Highest education level: Not on file  Occupational History   Not on file  Tobacco Use   Smoking status: Never   Smokeless tobacco: Not on file  Vaping Use   Vaping Use: Never used  Substance and Sexual Activity   Alcohol use: No   Drug use: No   Sexual activity: Yes    Birth control/protection: None  Other Topics Concern   Not on file  Social History Narrative   Not on file   Social Determinants of Health   Financial Resource Strain: Not on file  Food Insecurity: Not on file  Transportation  Needs: Not on file  Physical Activity: Not on file  Stress: Not on file  Social Connections: Not on file  Intimate Partner Violence: Not on file   Current Outpatient Medications on File Prior to Visit  Medication Sig Dispense Refill   ketoconazole (NIZORAL) 2 % cream Apply 1 Application topically daily. 15 g 0   Prenatal Vit-Fe Fumarate-FA (PRENATAL  MULTIVITAMIN) TABS tablet Take 1 tablet by mouth daily at 12 noon.     No current facility-administered medications on file prior to visit.   No Known Allergies  I have reviewed patient's Past Medical Hx, Surgical Hx, Family Hx, Social Hx, medications and allergies.   Review of Systems Review of Systems  Constitutional: Negative for fever and chills.  Gastrointestinal: Negative for abdominal pain.  Genitourinary: Negative for vaginal bleeding.  Musculoskeletal: Negative for back pain.  Neurological: Negative for dizziness and weakness.    Physical Exam  LMP 05/01/2022   Patient's last menstrual period was 05/01/2022. GENERAL: Well-developed, well-nourished female in no acute distress.  HEENT: Normocephalic, atraumatic.   LUNGS: Effort normal ABDOMEN: Deferred HEART: Regular rate  SKIN: Warm, dry and without erythema PSYCH: Normal mood and affect NEURO: Alert and oriented x 4  LAB RESULTS No results found for this or any previous visit (from the past 24 hour(s)).  IMAGING US OB Transvaginal--Awaiting formal read. + GS and YS. No embryo seen.    ASSESSMENT 1. Congenital pulmonary valve stenosis   2. Missed abortion     PLAN Awaiting formal US read from Attending. Pt and family questions addressed. Explained that we would have expected to see baby on the [place] in a normally progressing pregnancy. Will call pt with formal results and discuss POC.  Go to MAU as needed for heavy bleeding, abdominal pain or fever greater than 100.4.  Addendum: Reviewed images with Dr. Alysia Penna who confirms failed pregnancy.   [place], CNM 07/07/2022 11:03 AM

## 2022-07-07 NOTE — Telephone Encounter (Signed)
Called pt inform her of formal read of [place] confirming failed pregnancy and recommendation from Dr. Ilda Basset for "US guided suction D&C in the OR given how weird it looks". Please send [name] a message and set her up for an ultrasound guided suction d&c with next available attending.  No answer. Left VM for pt to call office and sent Clinical pool message to call pt.   Needs ABO/Rh.

## 2022-07-08 ENCOUNTER — Telehealth: Payer: Self-pay | Admitting: Advanced Practice Midwife

## 2022-07-08 NOTE — Telephone Encounter (Signed)
Pt returned CNM's call RE: Dx. missed AB. Discussed that [place] conclusively shows missed AB and that Dr. Thomes Dinning recommends US guided suction D&C due to unusual US findings. Also needs ABO/Rh. Pt verbalizes understanding. Requests consult visit with MD to discuss recommendation and requests to have LOA forms completed. Given fax # to fax leave forms. In-basket message sent to front office to schedule appt and labs. Support given.

## 2022-07-15 ENCOUNTER — Encounter: Payer: Self-pay | Admitting: Obstetrics and Gynecology

## 2022-07-15 ENCOUNTER — Other Ambulatory Visit: Payer: Self-pay

## 2022-07-15 ENCOUNTER — Ambulatory Visit: Payer: BC Managed Care – PPO | Admitting: Obstetrics and Gynecology

## 2022-07-15 VITALS — BP 131/67 | HR 81 | Ht 65.0 in | Wt 159.0 lb

## 2022-07-15 DIAGNOSIS — O021 Missed abortion: Secondary | ICD-10-CM | POA: Diagnosis not present

## 2022-07-15 NOTE — Progress Notes (Signed)
  CC: missed miscarriage Subjective:    Patient ID: MARQUIA COSTELLO, female    DOB: 20-Jan-2001, 21 y.o.   MRN: [id number]  HPI  21 yo G1P0 seen for follow up of MAU visits.  Pt seen several times with documented IUP noted; however, no fetal heart motion noted.  It was recommended that patient undergo suction dilation and curettage for completion of miscarriage.  Risks and benefits of the procedure given including bleeding, infection, involvement of other organs and uterine perforation.  Review of Systems     Objective:   Physical Exam Vitals:   07/15/22 1519  BP: 131/67  Pulse: 81   CLINICAL DATA:  Viability ultrasound   Exam: OBSTETRIC <14 WK [place] and TRANSVAGINAL OB US   Technique:  Transvaginal ultrasound examination was performed for complete evaluation of the gestation as well at the maternal uterus, adnexal regions, and pelvic cul-de-sac.  Transvaginal technique was performed to assess early pregnancy.   Comparison: Multiple prior ultrasounds with most recent one a week ago   Findings: Singleton IUP noted   Yolk sac: visualized   Embryo: ? visualized   Cardiac activity: none   CRL: ?CRL of 0.40mm   LMP EDC of 02/05/2023 consistent with 9/4 weeks and ultrasound today gives EDC of 03/04/2023 consistent with 5/5 weeks   Cervix: wnl   Adnexa: wnl with small likely resolving LO cyst   Subchorionic hemorrhage: resolving and 1.5-2cm in size   Other findings:  no free fluid in the pelvis RV uterus with what looks to get two gestational sac with a larger irregular one measuring 22.49mm (6/5 weeks) and a small one that is about 1.25cm. Larger sac looks like it has two possible yolk sacs and a small fetal pole attached to it. IN the smaller sac ?small fetal pole but no cardiac motion seen at either fetal pole candidate.   Impression: Missed abortion   Recommendations: Patient had an ultrasound two weeks ago where likely fetal pole seen and a definitive yolk sac was  seen. No viable IUP seen today meeting criteria for early pregnancy failure Patient seen by Jeanie Cooks today and ultrasound findings reviewed with an MD at that appointment   I sent my recommendations to: confirm her Rh status and given rhogam PRN, recommend ultrasound guided suction d&c with next available attending given odd appearing gestational sac and debris and findings in the sac   In basket and secure chat message sent to [place]:   1. Missed abortion Pt will be scheduled for Suction D and C, orders placed - Type and screen; Standing - CBC; Standing    Griffin Basil, MD Faculty Attending, [facility]

## 2022-07-16 ENCOUNTER — Other Ambulatory Visit: Payer: Self-pay

## 2022-07-16 ENCOUNTER — Telehealth: Payer: Self-pay

## 2022-07-16 ENCOUNTER — Encounter (HOSPITAL_COMMUNITY): Payer: Self-pay | Admitting: Obstetrics and Gynecology

## 2022-07-16 ENCOUNTER — Other Ambulatory Visit: Payer: Self-pay | Admitting: Obstetrics and Gynecology

## 2022-07-16 DIAGNOSIS — O283 Abnormal ultrasonic finding on antenatal screening of mother: Secondary | ICD-10-CM

## 2022-07-16 NOTE — Progress Notes (Signed)
Katherine Shaffer denies chest pain or shortness of breath.Patient denies having any s/s of Covid in her household, also denies any known exposure to Covid.  [name]'sPCP is Wilfred Lacy, NP

## 2022-07-16 NOTE — Telephone Encounter (Signed)
Called patient, given surgery date, time, location and preop instructions, patient expressed understanding.

## 2022-07-17 ENCOUNTER — Ambulatory Visit (HOSPITAL_COMMUNITY): Payer: BC Managed Care – PPO | Admitting: Anesthesiology

## 2022-07-17 ENCOUNTER — Other Ambulatory Visit: Payer: Self-pay

## 2022-07-17 ENCOUNTER — Ambulatory Visit (HOSPITAL_COMMUNITY): Payer: BC Managed Care – PPO

## 2022-07-17 ENCOUNTER — Ambulatory Visit (HOSPITAL_COMMUNITY)
Admission: RE | Admit: 2022-07-17 | Discharge: 2022-07-17 | Disposition: A | Discharge status: Home / Self Care | Payer: BC Managed Care – PPO | Attending: Obstetrics and Gynecology | Admitting: Obstetrics and Gynecology

## 2022-07-17 ENCOUNTER — Encounter (HOSPITAL_COMMUNITY)
Admission: RE | Disposition: A | Discharge status: Home / Self Care | Payer: Self-pay | Source: Home / Self Care | Attending: Obstetrics and Gynecology

## 2022-07-17 ENCOUNTER — Encounter (HOSPITAL_COMMUNITY): Payer: Self-pay | Admitting: Obstetrics and Gynecology

## 2022-07-17 DIAGNOSIS — Z9889 Other specified postprocedural states: Secondary | ICD-10-CM

## 2022-07-17 DIAGNOSIS — O021 Missed abortion: Secondary | ICD-10-CM

## 2022-07-17 HISTORY — PX: OPERATIVE ULTRASOUND: SHX5996

## 2022-07-17 HISTORY — PX: DILATION AND EVACUATION: SHX1459

## 2022-07-17 LAB — TYPE AND SCREEN
ABO/RH(D): A POS
Antibody Screen: NEGATIVE

## 2022-07-17 LAB — CBC
HCT: 37.4 % (ref 36.0–46.0)
Hemoglobin: 12.8 g/dL (ref 12.0–15.0)
MCH: 29.2 pg (ref 26.0–34.0)
MCHC: 34.2 g/dL (ref 30.0–36.0)
MCV: 85.4 fL (ref 80.0–100.0)
Platelets: 268 10*3/uL (ref 150–400)
RBC: 4.38 MIL/uL (ref 3.87–5.11)
RDW: 13.5 % (ref 11.5–15.5)
WBC: 8.9 10*3/uL (ref 4.0–10.5)
nRBC: 0 % (ref 0.0–0.2)

## 2022-07-17 LAB — ABO/RH: ABO/RH(D): A POS

## 2022-07-17 SURGERY — DILATION AND EVACUATION, UTERUS
Anesthesia: General | Site: Vagina

## 2022-07-17 MED ORDER — ACETAMINOPHEN 500 MG PO TABS: 1000.0000 mg | ORAL_TABLET | ORAL | Status: DC

## 2022-07-17 MED ORDER — SOD CITRATE-CITRIC ACID 500-334 MG/5ML PO SOLN
30.0000 mL | ORAL | Status: AC
  Administered 2022-07-17: 30 mL via ORAL
  Filled 2022-07-17: qty 30

## 2022-07-17 MED ORDER — PROMETHAZINE HCL 25 MG/ML IJ SOLN: 6.2500 mg | INTRAMUSCULAR | Status: DC | PRN

## 2022-07-17 MED ORDER — AMISULPRIDE (ANTIEMETIC) 5 MG/2ML IV SOLN: 10.0000 mg | Freq: Once | INTRAVENOUS | Status: DC | PRN

## 2022-07-17 MED ORDER — CHLORHEXIDINE GLUCONATE 0.12 % MT SOLN
OROMUCOSAL | Status: AC
  Administered 2022-07-17: 15 mL via OROMUCOSAL
  Filled 2022-07-17: qty 15

## 2022-07-17 MED ORDER — DEXAMETHASONE SODIUM PHOSPHATE 10 MG/ML IJ SOLN
INTRAMUSCULAR | Status: DC | PRN
  Administered 2022-07-17: 8 mg via INTRAVENOUS

## 2022-07-17 MED ORDER — CHLORHEXIDINE GLUCONATE 0.12 % MT SOLN: 15.0000 mL | Freq: Once | OROMUCOSAL | Status: AC

## 2022-07-17 MED ORDER — LACTATED RINGERS IV SOLN: INTRAVENOUS | Status: DC

## 2022-07-17 MED ORDER — KETOROLAC TROMETHAMINE 30 MG/ML IJ SOLN
INTRAMUSCULAR | Status: DC | PRN
  Administered 2022-07-17: 30 mg via INTRAVENOUS

## 2022-07-17 MED ORDER — FENTANYL CITRATE (PF) 100 MCG/2ML IJ SOLN: 25.0000 ug | INTRAMUSCULAR | Status: DC | PRN

## 2022-07-17 MED ORDER — ONDANSETRON HCL 4 MG/2ML IJ SOLN
INTRAMUSCULAR | Status: AC
  Filled 2022-07-17: qty 2

## 2022-07-17 MED ORDER — SCOPOLAMINE 1 MG/3DAYS TD PT72
1.0000 | MEDICATED_PATCH | TRANSDERMAL | Status: DC
  Administered 2022-07-17: 1.5 mg via TRANSDERMAL
  Filled 2022-07-17: qty 1

## 2022-07-17 MED ORDER — MIDAZOLAM HCL 2 MG/2ML IJ SOLN
INTRAMUSCULAR | Status: AC
  Filled 2022-07-17: qty 2

## 2022-07-17 MED ORDER — ACETAMINOPHEN 500 MG PO TABS
1000.0000 mg | ORAL_TABLET | Freq: Once | ORAL | Status: AC
  Administered 2022-07-17: 1000 mg via ORAL
  Filled 2022-07-17: qty 2

## 2022-07-17 MED ORDER — ONDANSETRON HCL 4 MG/2ML IJ SOLN
INTRAMUSCULAR | Status: DC | PRN
  Administered 2022-07-17: 4 mg via INTRAVENOUS

## 2022-07-17 MED ORDER — IBUPROFEN 600 MG PO TABS: 600.0000 mg | ORAL_TABLET | Freq: Four times a day (QID) | ORAL | 0 refills | Status: DC | PRN

## 2022-07-17 MED ORDER — ACETAMINOPHEN 500 MG PO CAPS: 1000.0000 mg | ORAL_CAPSULE | Freq: Three times a day (TID) | ORAL | 0 refills | Status: DC | PRN

## 2022-07-17 MED ORDER — MIDAZOLAM HCL 2 MG/2ML IJ SOLN
INTRAMUSCULAR | Status: DC | PRN
  Administered 2022-07-17 (×2): 1 mg via INTRAVENOUS

## 2022-07-17 MED ORDER — KETOROLAC TROMETHAMINE 30 MG/ML IJ SOLN
INTRAMUSCULAR | Status: AC
  Filled 2022-07-17: qty 1

## 2022-07-17 MED ORDER — PROPOFOL 10 MG/ML IV BOLUS
INTRAVENOUS | Status: AC
  Filled 2022-07-17: qty 20

## 2022-07-17 MED ORDER — FENTANYL CITRATE (PF) 250 MCG/5ML IJ SOLN
INTRAMUSCULAR | Status: AC
  Filled 2022-07-17: qty 5

## 2022-07-17 MED ORDER — FENTANYL CITRATE (PF) 250 MCG/5ML IJ SOLN
INTRAMUSCULAR | Status: DC | PRN
  Administered 2022-07-17: 50 ug via INTRAVENOUS

## 2022-07-17 MED ORDER — POVIDONE-IODINE 10 % EX SWAB
2.0000 | Freq: Once | CUTANEOUS | Status: AC
  Administered 2022-07-17: 2 via TOPICAL

## 2022-07-17 MED ORDER — 0.9 % SODIUM CHLORIDE (POUR BTL) OPTIME
TOPICAL | Status: DC | PRN
  Administered 2022-07-17: 1000 mL

## 2022-07-17 MED ORDER — DOXYCYCLINE HYCLATE 100 MG IV SOLR
200.0000 mg | INTRAVENOUS | Status: AC
  Administered 2022-07-17: 200 mg via INTRAVENOUS
  Filled 2022-07-17: qty 200

## 2022-07-17 MED ORDER — LIDOCAINE HCL 1 % IJ SOLN
INTRAMUSCULAR | Status: DC | PRN
  Administered 2022-07-17: 20 mL

## 2022-07-17 MED ORDER — PROPOFOL 10 MG/ML IV BOLUS
INTRAVENOUS | Status: DC | PRN
  Administered 2022-07-17 (×2): 50 mg via INTRAVENOUS

## 2022-07-17 MED ORDER — ORAL CARE MOUTH RINSE: 15.0000 mL | Freq: Once | OROMUCOSAL | Status: AC

## 2022-07-17 SURGICAL SUPPLY — 22 items
CATH ROBINSON RED A/P 16FR (CATHETERS) IMPLANT
FILTER UTR ASPR ASSEMBLY (MISCELLANEOUS) ×1 IMPLANT
GLOVE BIOGEL PI IND STRL 7.0 (GLOVE) ×1 IMPLANT
GLOVE BIOGEL PI IND STRL 7.5 (GLOVE) ×1 IMPLANT
GLOVE NEODERM STER SZ 7 (GLOVE) ×1 IMPLANT
GOWN STRL REUS W/ TWL LRG LVL3 (GOWN DISPOSABLE) ×1 IMPLANT
GOWN STRL REUS W/ TWL XL LVL3 (GOWN DISPOSABLE) ×1 IMPLANT
GOWN STRL REUS W/TWL LRG LVL3 (GOWN DISPOSABLE) ×1
GOWN STRL REUS W/TWL XL LVL3 (GOWN DISPOSABLE) ×1
HOSE CONNECTING 18IN BERKELEY (TUBING) ×1 IMPLANT
KIT BERKELEY 1ST TRI 3/8 NO TR (MISCELLANEOUS) ×1 IMPLANT
KIT BERKELEY 1ST TRIMESTER 3/8 (MISCELLANEOUS) ×1 IMPLANT
NS IRRIG 1000ML POUR BTL (IV SOLUTION) ×1 IMPLANT
PACK VAGINAL MINOR WOMEN LF (CUSTOM PROCEDURE TRAY) ×1 IMPLANT
PAD OB MATERNITY 4.3X12.25 (PERSONAL CARE ITEMS) ×1 IMPLANT
SET BERKELEY SUCTION TUBING (SUCTIONS) ×1 IMPLANT
SPIKE FLUID TRANSFER (MISCELLANEOUS) ×1 IMPLANT
TOWEL GREEN STERILE FF (TOWEL DISPOSABLE) ×2 IMPLANT
VACURETTE 10 RIGID CVD (CANNULA) IMPLANT
VACURETTE 7MM CVD STRL WRAP (CANNULA) IMPLANT
VACURETTE 8 RIGID CVD (CANNULA) IMPLANT
VACURETTE 9 RIGID CVD (CANNULA) IMPLANT

## 2022-07-17 NOTE — Op Note (Addendum)
Operative Note   07/17/2022  PRE-OP DIAGNOSIS: missed AB   POST-OP DIAGNOSIS: Same  SURGEON: Surgeon(s) and Role:    Aletha Halim, MD - Primary  ASSISTANT: none  PROCEDURE:  Suction dilation and curettage  ANESTHESIA: General and paracervical block  ESTIMATED BLOOD LOSS: 260mL  DRAINS: I/O cath for 10mL  TOTAL IV FLUIDS: per OR note  SPECIMENS: products of conception to pathology and [name]  VTE PROPHYLAXIS: SCDs to the bilateral lower extremities  ANTIBIOTICS: Doxycycline 200mg  IV x 1 pre op  COMPLICATIONS: none  DISPOSITION: PACU - hemodynamically stable.  CONDITION: stable  BLOOD TYPE: A POS Performed at [facility], [place] [address] [place],  [address] . Rhogam given:not applicable  FINDINGS: Exam under anesthesia revealed 6 week sized uterus with no masses and bilateral adnexa without masses or fullness. Normal EGBUS, vagina and cervix. Ultrasound ultrasound findings with large yolk sac and ?fetal pole but no cardiac motion. Necrotic appearing products of conception were seen, with gritty texture in all four quadrants. Thin, avascular endometrial stripe at the conclusion of the case.   PROCEDURE IN DETAIL:  After informed consent was obtained, the patient was taken to the operating room where anesthesia was obtained without difficulty. The patient was positioned in the dorsal lithotomy position in [place]. The patient was examined under anesthesia, with the above noted findings.  The bi-valved speculum was placed inside the patient's vagina, and the the anterior lip of the cervix was seen and grasped with the tenaculum.  A paracervical block was achieved with 80mL of 1% lidocaine 54mL and then the cervix was progressively dilated to a 27 French-Pratt dilator.  The suction was then calibrated to 36mmHg and connected to the number 9 cannula, which was then introduced with the above noted findings. A gentle curettage was done at the end and yield no  products of conception.   The suction was then done one more time to remove any remaining curettage material. Ultrasound was used throughout the entirety of the procedure   Excellent hemostasis was noted, and all instruments were removed, with excellent hemostasis noted throughout.  She was then taken out of dorsal lithotomy. The patient tolerated the procedure well.  Sponge, lap and instrument counts were correct x2.  The patient was taken to recovery room in excellent condition.  Durene Romans MD Attending [facility] [employer] [facility] [employer])

## 2022-07-17 NOTE — Anesthesia Procedure Notes (Signed)
Procedure Name: LMA Insertion Date/Time: 07/17/2022 12:55 PM  Performed by: Sammie Bench, CRNAPre-anesthesia Checklist: Patient identified, Emergency Drugs available, Suction available and Patient being monitored Patient Re-evaluated:Patient Re-evaluated prior to induction Oxygen Delivery Method: Circle System Utilized Preoxygenation: Pre-oxygenation with 100% oxygen Induction Type: IV induction Ventilation: Mask ventilation without difficulty LMA: LMA inserted LMA Size: 4.0 Number of attempts: 1 Airway Equipment and Method: Bite block Placement Confirmation: positive ETCO2 Tube secured with: Tape Dental Injury: Teeth and Oropharynx as per pre-operative assessment

## 2022-07-17 NOTE — Discharge Instructions (Addendum)
   We will discuss your surgery once again in detail at your post-op visit in two to four weeks. If you haven't already done so, please call to make your appointment as soon as possible.  Dilation and Curettage or Vacuum Curettage, Care After These instructions give you information on caring for yourself after your procedure. Your doctor may also give you more specific instructions. Call your doctor if you have any problems or questions after your procedure. HOME CARE Do not drive for 24 hours. Wait 1 week before doing any activities that wear you out. Do not stand for a long time. Limit stair climbing to once or twice a day. Rest often. Continue with your usual diet. Drink enough fluids to keep your pee (urine) clear or pale yellow. If you have a hard time pooping (constipation), you may: Take a medicine to help you go poop (laxative) as told by your doctor. Eat more fruit and bran. Drink more fluids. Take showers, not baths, for as long as told by your doctor. Do not swim or use a hot tub until your doctor says it is okay. Have someone with you for 1day after the procedure. Do not douche, use tampons, or have sex (intercourse) until seen by your doctor Only take medicines as told by your doctor. Do not take aspirin. It can cause bleeding. Keep all doctor visits. GET HELP IF: You have cramps or pain not helped by medicine. You have new pain in the belly (abdomen). You have a bad smelling fluid coming from your vagina. You have a rash. You have problems with any medicine. GET HELP RIGHT AWAY IF:  You start to bleed more than a regular period. You have a fever. You have chest pain. You have trouble breathing. You feel dizzy or feel like passing out (fainting). You pass out. You have pain in the tops of your shoulders. You have vaginal bleeding with or without clumps of blood (blood clots). MAKE SURE YOU: Understand these instructions. Will watch your condition. Will get help  right away if you are not doing well or get worse. Document Released: 07/14/2008 Document Revised: 10/10/2013 Document Reviewed: 05/04/2013 ExitCare Patient Information 2015 [employer]. This information is not intended to replace advice given to you by your health care provider. Make sure you discuss any questions you have with your health care provider.   

## 2022-07-17 NOTE — H&P (Signed)
Obstetrics & Gynecology Surgical H&P   Date of Surgery: 07/17/2022    Primary OBGYN: [facility]  Reason for Admission: scheduled d&c  History of Present Illness: Katherine Shaffer is a 21 y.o. G1P0 (Patient's last menstrual period was 05/01/2022.), with the above CC. PMHx is significant for nothing.  Patient seen and diagnosed with missed AB of IUP; 9/19 u/s showed YS but no definitive embryo two weeks after an u/s showed similar findings. Patient seen in the office yesterday and d&c recommended given numerous cysts seen on u/s instead of medical management  ROS: A 12-point review of systems was performed and negative, except as stated in the above HPI.  OBGYN History: As per HPI. OB History  Gravida Para Term Preterm AB Living  1            SAB IAB Ectopic Multiple Live Births               # Outcome Date GA Lbr Len/2nd Weight Sex Delivery Anes PTL Lv  1 Gravida              Past Medical History: Past Medical History:  Diagnosis Date   Heart murmur    echo 2016    Past Surgical History: Past Surgical History:  Procedure Laterality Date   cyst on lip removed     WISDOM TOOTH EXTRACTION      Family History:  Family History  Problem Relation Age of Onset   Hypertension Mother    Glaucoma Mother    Diabetes Maternal Grandmother    Stroke Maternal Grandfather 78   Dementia Maternal Grandfather    Glaucoma Paternal Grandmother     Social History:  Social History   Socioeconomic History   Marital status: Single    Spouse name: Not on file   Number of children: Not on file   Years of education: Not on file   Highest education level: Not on file  Occupational History   Not on file  Tobacco Use   Smoking status: Never   Smokeless tobacco: Never  Vaping Use   Vaping Use: Never used  Substance and Sexual Activity   Alcohol use: No   Drug use: No   Sexual activity: Yes    Birth control/protection: None  Other Topics  Concern   Not on file  Social History Narrative   Not on file   Social Determinants of Health   Financial Resource Strain: Not on file  Food Insecurity: Not on file  Transportation Needs: Not on file  Physical Activity: Not on file  Stress: Not on file  Social Connections: Not on file  Intimate Partner Violence: Not on file   Allergy: No Known Allergies  Current Outpatient Medications: Medications Prior to Admission  Medication Sig Dispense Refill Last Dose   ketoconazole (NIZORAL) 2 % cream Apply 1 Application topically daily. 15 g 0 Past Week     Hospital Medications: Current Facility-Administered Medications  Medication Dose Route Frequency Provider Last Rate Last Admin   acetaminophen (TYLENOL) tablet 1,000 mg  1,000 mg Oral On Call to [place], MD       doxycycline (VIBRAMYCIN) 200 mg in dextrose 5 % 250 mL IVPB  200 mg Intravenous STAT Griffin Basil, MD       lactated ringers infusion   Intravenous Continuous Griffin Basil, MD       lactated ringers infusion   Intravenous Continuous Duane Boston, MD 10 mL/hr at  07/17/22 1120 New Bag at 07/17/22 1120   scopolamine (TRANSDERM-SCOP) 1 MG/3DAYS 1.5 mg  1 patch Transdermal Q72H Duane Boston, MD   1.5 mg at 07/17/22 1107     Physical Exam:  Current Vital Signs 24h Vital Sign Ranges  T 98.5 F (36.9 C) Temp  Avg: 98.5 F (36.9 C)  Min: 98.5 F (36.9 C)  Max: 98.5 F (36.9 C)  BP 137/74 BP  Min: 137/74  Max: 137/74  HR 80 Pulse  Avg: 80  Min: 80  Max: 80  RR 18 Resp  Avg: 18  Min: 18  Max: 18  SaO2 100 % Room Air SpO2  Avg: 100 %  Min: 100 %  Max: 100 %       24 Hour I/O Current Shift I/O  Time Ins Outs No intake/output data recorded. No intake/output data recorded.    Body mass index is 26.46 kg/m. General appearance: Well nourished, well developed female in no acute distress.  Cardiovascular: S1, S2 normal, no murmur, rub or gallop, regular rate and rhythm Respiratory:  Clear to  auscultation bilateral. Normal respiratory effort Abdomen: positive bowel sounds and no masses, hernias; diffusely non tender to palpation, non distended Neuro/Psych:  Normal mood and affect.  Skin:  Warm and dry.  Extremities: no clubbing, cyanosis, or edema.   Laboratory:  Recent Labs  Lab 07/17/22 1132  WBC 8.9  HGB 12.8  HCT 37.4  PLT 268   No results for input(s): "NA", "K", "CL", "CO2", "BUN", "CREATININE", "CALCIUM", "PROT", "BILITOT", "ALKPHOS", "ALT", "AST", "GLUCOSE" in the last 168 hours.  Invalid input(s): "LABALBU" No results for input(s): "APTT", "INR", "PTT" in the last 168 hours.  Invalid input(s): "DRHAPTT" Recent Labs  Lab 07/17/22 1120  [place] A POS Performed at [facility], [place] [address] [place], [place] [address]     Imaging:  Narrative & Impression  CLINICAL DATA:  Viability ultrasound   Exam: OBSTETRIC <14 WK [place] and TRANSVAGINAL OB US   Technique:  Transvaginal ultrasound examination was performed for complete evaluation of the gestation as well at the maternal uterus, adnexal regions, and pelvic cul-de-sac.  Transvaginal technique was performed to assess early pregnancy.   Comparison: Multiple prior ultrasounds with most recent one a week ago   Findings: Singleton IUP noted   Yolk sac: visualized   Embryo: ? visualized   Cardiac activity: none   CRL: ?CRL of 0.62mm   LMP EDC of 02/05/2023 consistent with 9/4 weeks and ultrasound today gives EDC of 03/04/2023 consistent with 5/5 weeks   Cervix: wnl   Adnexa: wnl with small likely resolving LO cyst   Subchorionic hemorrhage: resolving and 1.5-2cm in size   Other findings:  no free fluid in the pelvis RV uterus with what looks to get two gestational sac with a larger irregular one measuring 22.62mm (6/5 weeks) and a small one that is about 1.25cm. Larger sac looks like it has two possible yolk sacs and a small fetal pole attached to it. IN the smaller sac ?small fetal pole but no  cardiac motion seen at either fetal pole candidate.   Impression: Missed abortion   Recommendations: Patient had an ultrasound two weeks ago where likely fetal pole seen and a definitive yolk sac was seen. No viable IUP seen today meeting criteria for early pregnancy failure Patient seen by Jeanie Cooks today and ultrasound findings reviewed with an MD at that appointment   I sent my recommendations to: confirm her Rh status  and given rhogam PRN, recommend ultrasound guided suction d&c with next available attending given odd appearing gestational sac and debris and findings in the sac   In basket and secure chat message sent to CNM Marton Redwood MD Attending [facility] [place] (Faculty Practice) 07/07/2022 Time: 2767    Assessment: Katherine Shaffer is a 21 y.o. G1P0 (Patient's last menstrual period was 05/01/2022.) here for scheduled d&c Plan: Patient amenable to proceeding. She is also amenable to [employer].    Durene Romans MD Attending Center for [facility])

## 2022-07-17 NOTE — Anesthesia Preprocedure Evaluation (Addendum)
Anesthesia Evaluation  Patient identified by MRN, date of birth, ID band Patient awake    Reviewed: Allergy & Precautions, NPO status , Patient's Chart, lab work & pertinent test results  History of Anesthesia Complications Negative for: history of anesthetic complications  Airway Mallampati: II  TM Distance: >3 FB Neck ROM: Full    Dental no notable dental hx. (+) Dental Advisory Given   Pulmonary neg pulmonary ROS,    Pulmonary exam normal        Cardiovascular negative cardio ROS Normal cardiovascular exam     Neuro/Psych negative neurological ROS     GI/Hepatic negative GI ROS, Neg liver ROS,   Endo/Other  negative endocrine ROS  Renal/GU negative Renal ROS     Musculoskeletal negative musculoskeletal ROS (+)   Abdominal   Peds  Hematology negative hematology ROS (+)   Anesthesia Other Findings   Reproductive/Obstetrics                            Anesthesia Physical Anesthesia Plan  ASA: 2  Anesthesia Plan: General   Post-op Pain Management: Tylenol PO (pre-op)*   Induction: Intravenous  PONV Risk Score and Plan: 4 or greater and Ondansetron, Dexamethasone, Midazolam and Treatment may vary due to age or medical condition  Airway Management Planned: LMA  Additional Equipment:   Intra-op Plan:   Post-operative Plan: Extubation in OR  Informed Consent: I have reviewed the patients History and Physical, chart, labs and discussed the procedure including the risks, benefits and alternatives for the proposed anesthesia with the patient or authorized representative who has indicated his/her understanding and acceptance.     Dental advisory given  Plan Discussed with: Anesthesiologist and CRNA  Anesthesia Plan Comments:        Anesthesia Quick Evaluation

## 2022-07-17 NOTE — Anesthesia Postprocedure Evaluation (Signed)
Anesthesia Post Note  Patient: Katherine Shaffer  Procedure(s) Performed: DILATATION AND EVACUATION (Vagina ) OPERATIVE ULTRASOUND (Vagina )     Patient location during evaluation: PACU Anesthesia Type: General Level of consciousness: sedated Pain management: pain level controlled Vital Signs Assessment: post-procedure vital signs reviewed and stable Respiratory status: spontaneous breathing and respiratory function stable Cardiovascular status: stable Postop Assessment: no apparent nausea or vomiting Anesthetic complications: no   No notable events documented.  Last Vitals:  Vitals:   07/17/22 1400 07/17/22 1415  BP: 127/61 128/74  Pulse: 82 81  Resp: 10 10  Temp:  37.1 C  SpO2: 99% 100%    Last Pain:  Vitals:   07/17/22 1415  TempSrc:   PainSc: 0-No pain                 Marsi Turvey DANIEL

## 2022-07-17 NOTE — Transfer of Care (Signed)
Immediate Anesthesia Transfer of Care Note  Patient: Katherine Shaffer  Procedure(s) Performed: DILATATION AND EVACUATION (Vagina ) OPERATIVE ULTRASOUND (Vagina )  Patient Location: PACU  Anesthesia Type:General  Level of Consciousness: awake, alert , oriented and patient cooperative  Airway & Oxygen Therapy: Patient Spontanous Breathing and Patient connected to nasal cannula oxygen  Post-op Assessment: Report given to RN and Post -op Vital signs reviewed and stable  Post vital signs: Reviewed and stable  Last Vitals:  Vitals Value Taken Time  BP 148/90 07/17/22 1341  Temp    Pulse 117 07/17/22 1343  Resp 24 07/17/22 1343  SpO2 99 % 07/17/22 1343  Vitals shown include unvalidated device data.  Last Pain:  Vitals:   07/17/22 1101  TempSrc:   PainSc: 0-No pain         Complications: No notable events documented.

## 2022-07-18 ENCOUNTER — Encounter (HOSPITAL_COMMUNITY): Payer: Self-pay | Admitting: Obstetrics and Gynecology

## 2022-07-19 ENCOUNTER — Encounter: Payer: Self-pay | Admitting: Radiology

## 2022-07-20 LAB — SURGICAL PATHOLOGY

## 2022-08-10 ENCOUNTER — Encounter: Payer: Self-pay | Admitting: Obstetrics and Gynecology

## 2022-08-10 ENCOUNTER — Ambulatory Visit (INDEPENDENT_AMBULATORY_CARE_PROVIDER_SITE_OTHER): Payer: BC Managed Care – PPO | Admitting: Obstetrics and Gynecology

## 2022-08-10 ENCOUNTER — Encounter: Payer: Self-pay | Admitting: *Deleted

## 2022-08-10 ENCOUNTER — Encounter: Payer: Self-pay | Admitting: Nurse Practitioner

## 2022-08-10 ENCOUNTER — Other Ambulatory Visit: Payer: Self-pay

## 2022-08-10 VITALS — BP 127/80 | HR 81

## 2022-08-10 DIAGNOSIS — O021 Missed abortion: Secondary | ICD-10-CM

## 2022-08-10 DIAGNOSIS — Z872 Personal history of diseases of the skin and subcutaneous tissue: Secondary | ICD-10-CM

## 2022-08-10 NOTE — Progress Notes (Signed)
  Obstetrics and Gynecology Visit Return Patient Evaluation  Appointment Date: 08/10/2022  Primary Care Provider: Nche, [place] for [facility]  Chief Complaint: routine post op visit  History of Present Illness:  Katherine Shaffer is a 21 y.o. s/p 9/29 suction d&c for missed AB. Final pathology negative. [name] unable to be run due to maternal cell contamination.  Interval History: Since that time, she states that she had some bleeding and spotting but it has since stopped. No period yet. Last sex yesterday.   Review of Systems:  as noted in the History of Present Illness.   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 Hardin-Paulino had no medications administered during this visit. Current Outpatient Medications  Medication Sig Dispense Refill   Acetaminophen 500 MG capsule Take 2 capsules (1,000 mg total) by mouth every 8 (eight) hours as needed for fever. 30 capsule 0   ibuprofen (ADVIL) 600 MG tablet Take 1 tablet (600 mg total) by mouth every 6 (six) hours as needed. 30 tablet 0   ketoconazole (NIZORAL) 2 % cream Apply 1 Application topically daily. 15 g 0   No current facility-administered medications for this visit.    Allergies: has No Known Allergies.  Physical Exam:  BP 127/80   Pulse 81   LMP 05/01/2022 Comment: SAB There is no height or weight on file to calculate BMI. General appearance: Well nourished, well developed female in no acute distress.  Neuro/Psych:  Normal mood and affect.    Pelvic: declined  Assessment: patient doing well  Plan:  1. Missed abortion D/w her re: next steps  Patient has been on birth control for several years including Xulane for a few years but she was late a few days in changing a patch and that's when she got pregnant; she would like to continue to [place]. She states she has never had  a pap smear before; exam declined today. RTC in 1-2 months for annual, pap, refill xulane (BMI is 26). I told her to start her xulane when she has a period and/or wait 2 wks, take a home upt and if negative, start the xulane; I also told her to wait a week before considering it effective.    RTC: 1-2 months for annual   Durene Romans MD Attending Center for [employer] farm manager)

## 2022-08-10 NOTE — Progress Notes (Signed)
Reviewed 2 [name] Miscarriage test reports with Dr. Ilda Basset, he will review with patient at her gyn visit scheduled today. Katherine Shaffer

## 2022-08-20 ENCOUNTER — Encounter: Payer: Self-pay | Admitting: Obstetrics and Gynecology

## 2022-09-08 ENCOUNTER — Ambulatory Visit: Payer: BC Managed Care – PPO | Admitting: Obstetrics and Gynecology

## 2022-09-08 ENCOUNTER — Other Ambulatory Visit: Payer: Self-pay

## 2022-09-08 ENCOUNTER — Encounter: Payer: Self-pay | Admitting: Obstetrics and Gynecology

## 2022-09-08 VITALS — BP 127/83 | HR 116 | Ht 65.0 in | Wt 158.0 lb

## 2022-09-08 DIAGNOSIS — Z5321 Procedure and treatment not carried out due to patient leaving prior to being seen by health care provider: Secondary | ICD-10-CM

## 2022-09-08 NOTE — Progress Notes (Signed)
Went into room to see patient, patient not in room and had checked out. RN called patient and offered to return to be seen, no answer.

## 2022-09-09 ENCOUNTER — Ambulatory Visit: Payer: BC Managed Care – PPO | Admitting: Certified Nurse Midwife

## 2022-09-14 ENCOUNTER — Encounter: Payer: Self-pay | Admitting: Nurse Practitioner

## 2022-09-14 ENCOUNTER — Telehealth: Payer: BC Managed Care – PPO | Admitting: Family

## 2022-09-14 DIAGNOSIS — M25571 Pain in right ankle and joints of right foot: Secondary | ICD-10-CM | POA: Diagnosis not present

## 2022-09-14 DIAGNOSIS — M25572 Pain in left ankle and joints of left foot: Secondary | ICD-10-CM | POA: Diagnosis not present

## 2022-09-14 MED ORDER — PREDNISONE 20 MG PO TABS: 40.0000 mg | ORAL_TABLET | Freq: Every day | ORAL | 0 refills | Status: AC

## 2022-09-14 MED ORDER — DICLOFENAC SODIUM 75 MG PO TBEC: 75.0000 mg | DELAYED_RELEASE_TABLET | Freq: Two times a day (BID) | ORAL | 0 refills | Status: DC

## 2022-09-14 NOTE — Progress Notes (Signed)
Virtual Visit Consent   Katherine Shaffer, you are scheduled for a virtual visit with a [facility] provider today. Just as with appointments in the office, your consent must be obtained to participate. Your consent will be active for this visit and any virtual visit you may have with one of our providers in the next 365 days. If you have a MyChart account, a copy of this consent can be sent to you electronically.  As this is a virtual visit, video technology does not allow for your provider to perform a traditional examination. This may limit your provider's ability to fully assess your condition. If your provider identifies any concerns that need to be evaluated in person or the need to arrange testing (such as labs, EKG, etc.), we will make arrangements to do so. Although advances in technology are sophisticated, we cannot ensure that it will always work on either your end or our end. If the connection with a video visit is poor, the visit may have to be switched to a telephone visit. With either a video or telephone visit, we are not always able to ensure that we have a secure connection.  By engaging in this virtual visit, you consent to the provision of healthcare and authorize for your insurance to be billed (if applicable) for the services provided during this visit. Depending on your insurance coverage, you may receive a charge related to this service.  I need to obtain your verbal consent now. Are you willing to proceed with your visit today? Katherine Shaffer has provided verbal consent on 09/14/2022 for a virtual visit (video or telephone). Katherine Rodney, FNP  Date: 09/14/2022 6:32 PM  Virtual Visit via Video Note   I, Katherine Shaffer, connected with  Katherine Shaffer  ([id number], 02/21/2001) on 09/14/22 at  6:30 PM EST by a video-enabled telemedicine application and verified that I am speaking with the correct person using two identifiers.  Location: Patient: Virtual Visit  Location Patient: Home Provider: Virtual Visit Location Provider: Home Office   I discussed the limitations of evaluation and management by telemedicine and the availability of in person appointments. The patient expressed understanding and agreed to proceed.    History of Present Illness: Katherine Shaffer is a 21 y.o. who identifies as a female who was assigned female at birth, and is being seen today for bilateral ankle pain.  HPI: Ankle Pain  The incident occurred 2 days ago. There was no injury mechanism. The pain is present in the left ankle and right ankle. The quality of the pain is described as aching. The pain is at a severity of 5/10 (10 when walking). The pain is mild. The pain has been Worsening since onset. Pertinent negatives include no inability to bear weight, loss of sensation, muscle weakness, numbness or tingling. She reports no foreign bodies present. The symptoms are aggravated by weight bearing and movement. She has tried rest for the symptoms. The treatment provided mild relief.    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 (Patient not taking: Reported on 09/08/2022), Disp: 15 g, Rfl: 0  Observations/Objective: Patient is well-developed, well-nourished in no acute distress.  Resting comfortably  at home.  Head is normocephalic, atraumatic.  No  labored breathing.  Speech is clear and coherent with logical content.  Patient is alert and oriented at baseline.  Mild swelling of bilateral ankles, no erythemas   Assessment and Plan: 1. Acute bilateral ankle pain - diclofenac (VOLTAREN) 75 MG EC tablet; Take 1 tablet (75 mg total) by mouth 2 (two) times daily.  Dispense: 30 tablet; Refill: 0 - predniSONE (DELTASONE) 20 MG tablet; Take 2 tablets (40 mg total) by mouth daily with breakfast for 5 days.  Dispense: 10 tablet; Refill: 0  Rest Compression  I do not think this gout or cellulitis. More likely inflammation.  Diclofenac BID with food Follow up if symptoms worsen or do not improve    Follow Up Instructions: I discussed the assessment and treatment plan with the patient. The patient was provided an opportunity to ask questions and all were answered. The patient agreed with the plan and demonstrated an understanding of the instructions.  A copy of instructions were sent to the patient via MyChart unless otherwise noted below.     The patient was advised to call back or seek an in-person evaluation if the symptoms worsen or if the condition fails to improve as anticipated.  Time:  I spent 7 minutes with the patient via telehealth technology discussing the above problems/concerns.    Katherine Rodney, FNP

## 2022-09-18 ENCOUNTER — Ambulatory Visit (INDEPENDENT_AMBULATORY_CARE_PROVIDER_SITE_OTHER): Payer: BC Managed Care – PPO | Admitting: Family Medicine

## 2022-09-18 ENCOUNTER — Encounter: Payer: Self-pay | Admitting: Family Medicine

## 2022-09-18 ENCOUNTER — Ambulatory Visit: Payer: BC Managed Care – PPO | Admitting: Nurse Practitioner

## 2022-09-18 VITALS — BP 126/80 | HR 71 | Temp 97.2°F | Ht 65.0 in | Wt 157.2 lb

## 2022-09-18 DIAGNOSIS — M25572 Pain in left ankle and joints of left foot: Secondary | ICD-10-CM | POA: Diagnosis not present

## 2022-09-18 DIAGNOSIS — M25571 Pain in right ankle and joints of right foot: Secondary | ICD-10-CM

## 2022-09-18 DIAGNOSIS — L409 Psoriasis, unspecified: Secondary | ICD-10-CM | POA: Insufficient documentation

## 2022-09-18 LAB — SEDIMENTATION RATE: Sed Rate: 20 mm/hr (ref 0–20)

## 2022-09-18 LAB — C-REACTIVE PROTEIN: CRP: 1.1 mg/dL (ref 0.5–20.0)

## 2022-09-18 NOTE — Progress Notes (Signed)
[facility] [address] [place] [address] Dept: [phone] Dept Fax: [phone]  Office Visit  Subjective:    Patient ID: Katherine Shaffer, female    DOB: [birth year], 21 y.o..   MRN: [id number]  Chief Complaint  Patient presents with   Acute Visit    C/o having bilateral ankle pain/bruising x 1 week.  No known injury.  Has been taking prednisone & diclofenac.     History of Present Illness:  Patient is in today for re-evaluation of bilateral lower leg/ankle pain. Katherine Shaffer notes this seemed to develop suddenly last Saturday. She does feel she had some mild swelling and redness of the ankle area. She also describes some red spots in the area. She had not been engaged in any new activities and has had no recent illness. She does have a past history of psoriasis. She has no family history of rheumatic diseases. She was seen for a video visit on 11/27. She was prescribed a course of prednisone and started on diclofenac. She has noted some improvement, but not resolution.   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   Past Surgical History:  Procedure Laterality Date   cyst on lip removed     DILATION AND EVACUATION N/A 07/17/2022   Procedure: DILATATION AND EVACUATION;  Surgeon: Dyess Bing, MD;  Location: [facility] OR;  Service: Gynecology;  Laterality: N/A;   OPERATIVE ULTRASOUND N/A 07/17/2022   Procedure: OPERATIVE ULTRASOUND;  Surgeon: Wineglass Bing, MD;  Location: [facility] OR;  Service: Gynecology;  Laterality: N/A;   WISDOM TOOTH EXTRACTION     Family History  Problem Relation Age of Onset   Hypertension Mother    Glaucoma Mother    Diabetes Maternal Grandmother    Stroke Maternal Grandfather 9   Dementia Maternal Grandfather    Glaucoma Paternal Grandmother    Outpatient Medications Prior to Visit  Medication Sig Dispense  Refill   Acetaminophen 500 MG capsule Take 2 capsules (1,000 mg total) by mouth every 8 (eight) hours as needed for fever. 30 capsule 0   diclofenac (VOLTAREN) 75 MG EC tablet Take 1 tablet (75 mg total) by mouth 2 (two) times daily. 30 tablet 0   ketoconazole (NIZORAL) 2 % cream Apply 1 Application topically daily. 15 g 0   predniSONE (DELTASONE) 20 MG tablet Take 2 tablets (40 mg total) by mouth daily with breakfast for 5 days. 10 tablet 0   No facility-administered medications prior to visit.   No Known Allergies    Objective:   Today's Vitals   09/18/22 0901  BP: 126/80  Pulse: 71  Temp: (!) 97.2 F (36.2 C)  TempSrc: Temporal  SpO2: 99%  Weight: 157 lb 3.2 oz (71.3 kg)  Height: 5\' 5"  (1.651 m)   Body mass index is 26.16 kg/m.   General: Well developed, well nourished. No acute distress. Extremities: Full ROM. No joint swelling. Moderate tenderness on palpation and with   movement of the ankle joints. No edema noted. Psych: Alert and oriented. Normal mood and affect.  Health Maintenance Due  Topic Date Due   PAP-Cervical Cytology Screening  Never done   PAP SMEAR-Modifier  Never done     Assessment & Plan:   1. Acute bilateral ankle pain I will check some screening labs for a possible autoimmune cause of the acute symmetrical arthropathy. With her history of psoriasis, it is possible that  this represents a psoriatic arthritis. I will have her complete the course of prednisone and stay on the diclofenac. She should follow-up with Ms. Nche in 10 days.  - Cyclic citrul peptide antibody, IgG ([facility]) - Rheumatoid factor - Sedimentation rate - C-reactive protein - ANA   Return in about 10 days (around 09/28/2022) for Reassessment by PCP.   Katherine Mast, MD

## 2022-09-21 LAB — ANA: Anti Nuclear Antibody (ANA): NEGATIVE

## 2022-09-21 LAB — CYCLIC CITRUL PEPTIDE ANTIBODY, IGG: Cyclic Citrullin Peptide Ab: <16 UNITS

## 2022-09-21 LAB — RHEUMATOID FACTOR: Rheumatoid fact SerPl-aCnc: <14 IU/mL (ref <14)

## 2022-10-07 ENCOUNTER — Ambulatory Visit: Payer: BC Managed Care – PPO | Admitting: Nurse Practitioner

## 2022-11-04 ENCOUNTER — Ambulatory Visit
Admission: RE | Admit: 2022-11-04 | Discharge: 2022-11-04 | Disposition: A | Discharge status: Home / Self Care | Payer: BC Managed Care – PPO | Source: Ambulatory Visit | Attending: Internal Medicine | Admitting: Internal Medicine

## 2022-11-04 VITALS — BP 127/67 | HR 89 | Temp 98.2°F | Resp 16

## 2022-11-04 DIAGNOSIS — R300 Dysuria: Secondary | ICD-10-CM

## 2022-11-04 DIAGNOSIS — R350 Frequency of micturition: Secondary | ICD-10-CM | POA: Diagnosis present

## 2022-11-04 DIAGNOSIS — Z113 Encounter for screening for infections with a predominantly sexual mode of transmission: Secondary | ICD-10-CM | POA: Insufficient documentation

## 2022-11-04 DIAGNOSIS — N898 Other specified noninflammatory disorders of vagina: Secondary | ICD-10-CM

## 2022-11-04 LAB — POCT URINALYSIS DIP (MANUAL ENTRY)
Bilirubin, UA: NEGATIVE
Glucose, UA: NEGATIVE mg/dL
Ketones, POC UA: NEGATIVE mg/dL
Nitrite, UA: NEGATIVE
Spec Grav, UA: 1.02 (ref 1.010–1.025)
Urobilinogen, UA: 0.2 E.U./dL
pH, UA: 5 (ref 5.0–8.0)

## 2022-11-04 LAB — POCT URINE PREGNANCY: Preg Test, Ur: NEGATIVE

## 2022-11-04 MED ORDER — CEPHALEXIN 500 MG PO CAPS: 500.0000 mg | ORAL_CAPSULE | Freq: Four times a day (QID) | ORAL | 0 refills | Status: DC

## 2022-11-04 NOTE — Discharge Instructions (Signed)
I have sent you an antibiotic that will treat urinary tract infection.  Your vaginal swab and urine culture are pending.  We will call if it is abnormal.  Please follow-up if any symptoms persist or worsen.

## 2022-11-04 NOTE — ED Provider Notes (Signed)
[facility]    CSN: [id number] Arrival date & time: 11/04/22  1516      History   Chief Complaint Chief Complaint  Patient presents with   Vaginal Discharge    Entered by patient    HPI Katherine Shaffer is a 22 y.o. female.   Patient reports some yellow vaginal discharge that started yesterday.  Patient reports that she has had some urinary frequency and some burning sensation.  Reports some left lower back pain and and some mild lower abdominal cramping as well.  Denies any history of fever, abnormal vaginal bleeding, hematuria.  Denies any exposure to STD but has had unprotected intercourse with a single sexual partner.  Last menstrual cycle was 10/13/2022.   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    Past Surgical History:  Procedure Laterality Date   cyst on lip removed     DILATION AND EVACUATION N/A 07/17/2022   Procedure: DILATATION AND EVACUATION;  Surgeon: Aletha Halim, MD;  Location: [facility];  Service: Gynecology;  Laterality: N/A;   OPERATIVE ULTRASOUND N/A 07/17/2022   Procedure: OPERATIVE ULTRASOUND;  Surgeon: Aletha Halim, MD;  Location: [facility];  Service: Gynecology;  Laterality: N/A;   WISDOM TOOTH EXTRACTION      OB History     Gravida  1   Para      Term      Preterm      AB      Living         SAB      IAB      Ectopic      Multiple      Live Births               Home Medications    Prior to Admission medications   Medication Sig Start Date End Date Taking? Authorizing Provider  cephALEXin (KEFLEX) 500 MG capsule Take 1 capsule (500 mg total) by mouth 4 (four) times daily. 11/04/22  Yes Oswaldo Conroy E, FNP  Acetaminophen 500 MG capsule Take 2 capsules (1,000 mg total) by mouth every 8 (eight) hours as needed for fever.  07/17/22   Aletha Halim, MD  diclofenac (VOLTAREN) 75 MG EC tablet Take 1 tablet (75 mg total) by mouth 2 (two) times daily. 09/14/22   Sharion Balloon, FNP  ketoconazole (NIZORAL) 2 % cream Apply 1 Application topically daily. 05/27/22   Nche, Charlene Brooke, NP    Family History Family History  Problem Relation Age of Onset   Hypertension Mother    Glaucoma Mother    Diabetes Maternal Grandmother    Stroke Maternal Grandfather 44   Dementia Maternal Grandfather    Glaucoma Paternal Grandmother     Social History Social History   Tobacco Use   Smoking status: Never   Smokeless tobacco: Never  Vaping Use   Vaping Use: Never used  Substance Use Topics   Alcohol use: No   Drug use: No     Allergies   Patient has no known allergies.   Review of Systems Review of Systems Per HPI  Physical Exam Triage Vital Signs ED Triage Vitals [11/04/22 1523]  Enc Vitals Group     BP 127/67     Pulse Rate 89     Resp 16  Temp 98.2 F (36.8 C)     Temp Source Oral     SpO2 98 %     Weight      Height      Head Circumference      Peak Flow      Pain Score 0     Pain Loc      Pain Edu?      Excl. in GC?    No data found.  Updated Vital Signs BP 127/67 (BP Location: Right Arm)   Pulse 89   Temp 98.2 F (36.8 C) (Oral)   Resp 16   SpO2 98%   Breastfeeding No   Visual Acuity Right Eye Distance:   Left Eye Distance:   Bilateral Distance:    Right Eye Near:   Left Eye Near:    Bilateral Near:     Physical Exam Constitutional:      General: She is not in acute distress.    Appearance: Normal appearance. She is not toxic-appearing or diaphoretic.  HENT:     Head: Normocephalic and atraumatic.  Eyes:     Extraocular Movements: Extraocular movements intact.     Conjunctiva/sclera: Conjunctivae normal.  Cardiovascular:     Rate and Rhythm: Normal rate and regular rhythm.     Pulses: Normal pulses.     Heart sounds: Normal heart sounds.  Pulmonary:      Effort: Pulmonary effort is normal. No respiratory distress.     Breath sounds: Normal breath sounds.  Abdominal:     General: Bowel sounds are normal. There is no distension.     Palpations: Abdomen is soft.     Tenderness: There is no abdominal tenderness.  Genitourinary:    Comments: Deferred with shared decision making.  Self swab performed. Neurological:     General: No focal deficit present.     Mental Status: She is alert and oriented to person, place, and time. Mental status is at baseline.  Psychiatric:        Mood and Affect: Mood normal.        Behavior: Behavior normal.        Thought Content: Thought content normal.        Judgment: Judgment normal.      UC Treatments / Results  Labs (all labs ordered are listed, but only abnormal results are displayed) Labs Reviewed  POCT URINALYSIS DIP (MANUAL ENTRY) - Abnormal; Notable for the following components:      Result Value   Clarity, UA cloudy (*)    Blood, UA moderate (*)    Protein Ur, POC trace (*)    Leukocytes, UA Moderate (2+) (*)    All other components within normal limits  URINE CULTURE  POCT URINE PREGNANCY  CERVICOVAGINAL ANCILLARY ONLY    EKG   Radiology No results found.  Procedures Procedures (including critical care time)  Medications Ordered in UC Medications - No data to display  Initial Impression / Assessment and Plan / UC Course  I have reviewed the triage vital signs and the nursing notes.  Pertinent labs & imaging results that were available during my care of the patient were reviewed by me and considered in my medical decision making (see chart for details).     UA is showing moderate leukocytes.  I am suspicious that this could be spilling over from vaginitis but will opt to treat for urinary tract infection today especially given lower abdominal cramping and back pain while awaiting other results.  Cephalexin prescribed for patient.  Urine culture and cervicovaginal swab  pending.  Will await results for any further treatment.  Patient to refrain from sexual activity until test results and treatment are complete.  Given no confirmed exposure to STD, I do think that waiting on test results is reasonable.  There is no concern for PID at this time.  Patient was advised to follow-up if any symptoms persist or worsen.  Patient verbalized understanding and was agreeable with plan. Final Clinical Impressions(s) / UC Diagnoses   Final diagnoses:  Urinary frequency  Dysuria  Vaginal discharge  Screening examination for venereal disease     Discharge Instructions      I have sent you an antibiotic that will treat urinary tract infection.  Your vaginal swab and urine culture are pending.  We will call if it is abnormal.  Please follow-up if any symptoms persist or worsen.     ED Prescriptions     Medication Sig Dispense Auth. Provider   cephALEXin (KEFLEX) 500 MG capsule Take 1 capsule (500 mg total) by mouth 4 (four) times daily. 28 capsule Rentz, Michele Rockers, [place]      PDMP not reviewed this encounter.   Teodora Medici, [place] 11/04/22 1600

## 2022-11-04 NOTE — ED Triage Notes (Signed)
Pt c/o clumpy yellow discharge onset ~ yesterday. Uncomfortable pelvic.

## 2022-11-05 LAB — URINE CULTURE: Culture: <10000 — AB

## 2022-11-05 LAB — CERVICOVAGINAL ANCILLARY ONLY
Bacterial Vaginitis (gardnerella): POSITIVE — AB
Candida Glabrata: NEGATIVE
Candida Vaginitis: POSITIVE — AB
Chlamydia: NEGATIVE
Comment: NEGATIVE
Comment: NEGATIVE
Comment: NEGATIVE
Comment: NEGATIVE
Comment: NEGATIVE
Comment: NORMAL
Neisseria Gonorrhea: NEGATIVE
Trichomonas: NEGATIVE

## 2022-11-06 ENCOUNTER — Telehealth (HOSPITAL_COMMUNITY): Payer: Self-pay | Admitting: Emergency Medicine

## 2022-11-06 MED ORDER — METRONIDAZOLE 500 MG PO TABS: 500.0000 mg | ORAL_TABLET | Freq: Two times a day (BID) | ORAL | 0 refills | Status: DC

## 2022-11-06 MED ORDER — FLUCONAZOLE 150 MG PO TABS: 150.0000 mg | ORAL_TABLET | Freq: Once | ORAL | 0 refills | Status: AC

## 2023-05-12 LAB — HM PAP SMEAR

## 2023-09-09 ENCOUNTER — Telehealth: Payer: BC Managed Care – PPO | Admitting: Nurse Practitioner

## 2023-09-09 DIAGNOSIS — B3731 Acute candidiasis of vulva and vagina: Secondary | ICD-10-CM

## 2023-09-09 MED ORDER — FLUCONAZOLE 150 MG PO TABS: 150.0000 mg | ORAL_TABLET | Freq: Once | ORAL | 0 refills | Status: AC

## 2023-09-09 NOTE — Progress Notes (Signed)
Virtual Visit Consent   Katherine Shaffer, you are scheduled for a virtual visit with a [facility] provider today. Just as with appointments in the office, your consent must be obtained to participate. Your consent will be active for this visit and any virtual visit you may have with one of our providers in the next 365 days. If you have a MyChart account, a copy of this consent can be sent to you electronically.  As this is a virtual visit, video technology does not allow for your provider to perform a traditional examination. This may limit your provider's ability to fully assess your condition. If your provider identifies any concerns that need to be evaluated in person or the need to arrange testing (such as labs, EKG, etc.), we will make arrangements to do so. Although advances in technology are sophisticated, we cannot ensure that it will always work on either your end or our end. If the connection with a video visit is poor, the visit may have to be switched to a telephone visit. With either a video or telephone visit, we are not always able to ensure that we have a secure connection.  By engaging in this virtual visit, you consent to the provision of healthcare and authorize for your insurance to be billed (if applicable) for the services provided during this visit. Depending on your insurance coverage, you may receive a charge related to this service.  I need to obtain your verbal consent now. Are you willing to proceed with your visit today? Katherine Shaffer has provided verbal consent on 09/09/2023 for a virtual visit (video or telephone). Viviano Simas, FNP  Date: 09/09/2023 5:51 PM  Virtual Visit via Video Note   I, Viviano Simas, connected with  Katherine Shaffer  ([id number], [birth year]) on 09/09/23 at  6:00 PM EST by a video-enabled telemedicine application and verified that I am speaking with the correct person using two identifiers.  Location: Patient: Virtual Visit  Location Patient: Home Provider: Virtual Visit Location Provider: Home Office   I discussed the limitations of evaluation and management by telemedicine and the availability of in person appointments. The patient expressed understanding and agreed to proceed.    History of Present Illness: Katherine Shaffer is a 22 y.o. 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    Allergies: No Known Allergies Medications:  Current Outpatient Medications:    Acetaminophen 500 MG capsule, Take 2 capsules (1,000 mg total) by mouth every 8 (eight) hours as needed for fever., Disp: 30 capsule, Rfl: 0   cephALEXin (KEFLEX) 500 MG capsule, Take 1 capsule (500 mg total) by mouth 4 (four) times daily., Disp: 28 capsule, Rfl: 0   diclofenac (VOLTAREN) 75 MG EC tablet, Take 1 tablet (75 mg total) by mouth 2 (two) times daily., Disp: 30 tablet, Rfl: 0   ketoconazole (NIZORAL) 2 % cream, Apply 1 Application topically daily., Disp: 15 g, Rfl: 0   metroNIDAZOLE (FLAGYL) 500 MG tablet, Take 1 tablet (500 mg total)  by mouth 2 (two) times daily., Disp: 14 tablet, Rfl: 0  Observations/Objective: Patient is well-developed, well-nourished in no acute distress.  Resting comfortably  at home.  Head is normocephalic, atraumatic.  No labored breathing.  Speech is clear and coherent with logical content.  Patient is alert and oriented at baseline.    Assessment and Plan:  1. Vaginal yeast infection   - fluconazole (DIFLUCAN) 150 MG tablet; Take 1 tablet (150 mg total) by mouth  once for 1 dose.  Dispense: 1 tablet; Refill: 0     Follow Up Instructions: I discussed the assessment and treatment plan with the patient. The patient was provided an opportunity to ask questions and all were answered. The patient agreed with the plan and demonstrated an understanding of the instructions.  A copy of instructions were sent to the patient via MyChart unless otherwise noted below.    The patient was advised to call back or seek an in-person evaluation if the symptoms worsen or if the condition fails to improve as anticipated.    Viviano Simas, FNP

## 2023-12-11 LAB — OB RESULTS CONSOLE GC/CHLAMYDIA: Chlamydia: NEGATIVE

## 2024-01-21 ENCOUNTER — Ambulatory Visit: Payer: Self-pay

## 2024-01-21 NOTE — Telephone Encounter (Signed)
 Nurse triage scheduled patient to see Dr. Janee Morn on 01/27/2024. Thanks, BMS/CMA

## 2024-01-21 NOTE — Telephone Encounter (Signed)
  Chief Complaint: high BP Symptoms: denies Frequency: over the last few days Pertinent Negatives: Patient denies dizziness, HA Disposition: [] ED /[] Urgent Care (no appt availability in office) / [x] Appointment(In office/virtual)/ []  [facility]/ [] Home Care/ [] Refused Recommended Disposition /[] [facility]/ []  Follow-up with PCP Additional Notes: Pt denies hx of HTN, states that at her dentist last week her reading was high so she has been monitoring her BP at home. Pt has been getting readings systolic 150s and 160s, with a diastolic numbers in the 90s. Pt denies symptoms both in the past and during the call. Pt advised of care instructions per Epic, pt scheduled.  Copied from CRM [id number]. Topic: Clinical - Red Word Triage >> Jan 21, 2024  8:40 AM Sonny Dandy B wrote: [facility] that prompted transfer to Nurse Triage: Elevated blood pressure 152/97 Reason for Disposition  [1] Systolic BP  >= 130 OR Diastolic >= 80 AND [2] not taking BP medications  Answer Assessment - Initial Assessment Questions 1. BLOOD PRESSURE: "What is the blood pressure?" "Did you take at least two measurements 5 minutes apart?"     152/97 2. ONSET: "When did you take your blood pressure?"     unsure 3. HOW: "How did you take your blood pressure?" (e.g., automatic home BP monitor, visiting nurse)     automatic 4. HISTORY: "Do you have a history of high blood pressure?"     No hx prior to this 5. MEDICINES: "Are you taking any medicines for blood pressure?" "Have you missed any doses recently?"     denies 6. OTHER SYMPTOMS: "Do you have any symptoms?" (e.g., blurred vision, chest pain, difficulty breathing, headache, weakness)     denies 7. PREGNANCY: "Is there any chance you are pregnant?" "When was your last menstrual period?"     denies  Protocols used: Blood Pressure - High-A-AH

## 2024-01-26 ENCOUNTER — Encounter: Payer: Self-pay | Admitting: Nurse Practitioner

## 2024-01-26 ENCOUNTER — Telehealth: Payer: Self-pay | Admitting: Nurse Practitioner

## 2024-01-26 ENCOUNTER — Ambulatory Visit: Payer: Self-pay | Admitting: Nurse Practitioner

## 2024-01-26 VITALS — BP 138/80 | HR 80 | Temp 98.4°F | Ht 65.0 in | Wt 162.4 lb

## 2024-01-26 DIAGNOSIS — I10 Essential (primary) hypertension: Secondary | ICD-10-CM | POA: Insufficient documentation

## 2024-01-26 LAB — TSH: TSH: 1.19 u[IU]/mL (ref 0.35–5.50)

## 2024-01-26 LAB — COMPREHENSIVE METABOLIC PANEL WITH GFR
ALT: 9 U/L (ref 0–35)
AST: 14 U/L (ref 0–37)
Albumin: 4.4 g/dL (ref 3.5–5.2)
Alkaline Phosphatase: 55 U/L (ref 39–117)
BUN: 12 mg/dL (ref 6–23)
CO2: 25 meq/L (ref 19–32)
Calcium: 9 mg/dL (ref 8.4–10.5)
Chloride: 104 meq/L (ref 96–112)
Creatinine, Ser: 0.83 mg/dL (ref 0.40–1.20)
GFR: 99.07 mL/min (ref >60.00)
Glucose, Bld: 77 mg/dL (ref 70–99)
Potassium: 3.8 meq/L (ref 3.5–5.1)
Sodium: 137 meq/L (ref 135–145)
Total Bilirubin: 0.3 mg/dL (ref 0.2–1.2)
Total Protein: 7.5 g/dL (ref 6.0–8.3)

## 2024-01-26 LAB — CBC
HCT: 38.5 % (ref 36.0–46.0)
Hemoglobin: 12.7 g/dL (ref 12.0–15.0)
MCHC: 33 g/dL (ref 30.0–36.0)
MCV: 85.6 fl (ref 78.0–100.0)
Platelets: 337 10*3/uL (ref 150.0–400.0)
RBC: 4.5 Mil/uL (ref 3.87–5.11)
RDW: 13.5 % (ref 11.5–15.5)
WBC: 7.2 10*3/uL (ref 4.0–10.5)

## 2024-01-26 NOTE — Patient Instructions (Addendum)
 Go to lab Maintain DASH diet Start amlodipine 5mg  in PM  DASH Eating Plan DASH stands for Dietary Approaches to Stop Hypertension. The DASH eating plan is a healthy eating plan that has been shown to: Lower high blood pressure (hypertension). Reduce your risk for type 2 diabetes, heart disease, and stroke. Help with weight loss. What are tips for following this plan? Reading food labels Check food labels for the amount of salt (sodium) per serving. Choose foods with less than 5 percent of the Daily Value (DV) of sodium. In general, foods with less than 300 milligrams (mg) of sodium per serving fit into this eating plan. To find whole grains, look for the word "whole" as the first word in the ingredient list. Shopping Buy products labeled as "low-sodium" or "no salt added." Buy fresh foods. Avoid canned foods and pre-made or frozen meals. Cooking Try not to add salt when you cook. Use salt-free seasonings or herbs instead of table salt or sea salt. Check with your health care provider or pharmacist before using salt substitutes. Do not fry foods. Cook foods in healthy ways, such as baking, boiling, grilling, roasting, or broiling. Cook using oils that are good for your heart. These include olive, canola, avocado, soybean, and sunflower oil. Meal planning  Eat a balanced diet. This should include: 4 or more servings of fruits and 4 or more servings of vegetables each day. Try to fill half of your plate with fruits and vegetables. 6-8 servings of whole grains each day. 6 or less servings of lean meat, poultry, or fish each day. 1 oz is 1 serving. A 3 oz (85 g) serving of meat is about the same size as the palm of your hand. One egg is 1 oz (28 g). 2-3 servings of low-fat dairy each day. One serving is 1 cup (237 mL). 1 serving of nuts, seeds, or beans 5 times each week. 2-3 servings of heart-healthy fats. Healthy fats called omega-3 fatty acids are found in foods such as walnuts, flaxseeds,  fortified milks, and eggs. These fats are also found in cold-water fish, such as sardines, salmon, and mackerel. Limit how much you eat of: Canned or prepackaged foods. Food that is high in trans fat, such as fried foods. Food that is high in saturated fat, such as fatty meat. Desserts and other sweets, sugary drinks, and other foods with added sugar. Full-fat dairy products. Do not salt foods before eating. Do not eat more than 4 egg yolks a week. Try to eat at least 2 vegetarian meals a week. Eat more home-cooked food and less restaurant, buffet, and fast food. Lifestyle When eating at a restaurant, ask if your food can be made with less salt or no salt. If you drink alcohol: Limit how much you have to: 0-1 drink a day if you are female. 0-2 drinks a day if you are female. Know how much alcohol is in your drink. In the [place] one drink is one 12 oz bottle of beer (355 mL), one 5 oz glass of wine (148 mL), or one 1 oz glass of hard liquor (44 mL). General information Avoid eating more than 2,300 mg of salt a day. If you have hypertension, you may need to reduce your sodium intake to 1,500 mg a day. Work with your provider to stay at a healthy body weight or lose weight. Ask what the best weight range is for you. On most days of the week, get at least 30 minutes of exercise that  causes your heart to beat faster. This may include walking, swimming, or biking. Work with your provider or dietitian to adjust your eating plan to meet your specific calorie needs. What foods should I eat? Fruits All fresh, dried, or frozen fruit. Canned fruits that are in their natural juice and do not have sugar added to them. Vegetables Fresh or frozen vegetables that are raw, steamed, roasted, or grilled. Low-sodium or reduced-sodium tomato and vegetable juice. Low-sodium or reduced-sodium tomato sauce and tomato paste. Low-sodium or reduced-sodium canned vegetables. Grains Whole-grain or whole-wheat bread.  Whole-grain or whole-wheat pasta. Brown rice. Orpah Cobb. Bulgur. Whole-grain and low-sodium cereals. Pita bread. Low-fat, low-sodium crackers. Whole-wheat flour tortillas. Meats and other proteins Skinless chicken or [place]. Ground chicken or [place]. Pork with fat trimmed off. Fish and seafood. Egg whites. Dried beans, peas, or lentils. Unsalted nuts, nut butters, and seeds. Unsalted canned beans. Lean cuts of beef with fat trimmed off. Low-sodium, lean precooked or cured meat, such as sausages or meat loaves. Dairy Low-fat (1%) or fat-free (skim) milk. Reduced-fat, low-fat, or fat-free cheeses. Nonfat, low-sodium ricotta or cottage cheese. Low-fat or nonfat yogurt. Low-fat, low-sodium cheese. Fats and oils Soft margarine without trans fats. Vegetable oil. Reduced-fat, low-fat, or light mayonnaise and salad dressings (reduced-sodium). Canola, safflower, olive, avocado, soybean, and sunflower oils. Avocado. Seasonings and condiments Herbs. Spices. Seasoning mixes without salt. Other foods Unsalted popcorn and pretzels. Fat-free sweets. The items listed above may not be all the foods and drinks you can have. Talk to a dietitian to learn more. What foods should I avoid? Fruits Canned fruit in a light or heavy syrup. Fried fruit. Fruit in cream or butter sauce. Vegetables Creamed or fried vegetables. Vegetables in a cheese sauce. Regular canned vegetables that are not marked as low-sodium or reduced-sodium. Regular canned tomato sauce and paste that are not marked as low-sodium or reduced-sodium. Regular tomato and vegetable juices that are not marked as low-sodium or reduced-sodium. Rosita Fire. Olives. Grains Baked goods made with fat, such as croissants, muffins, or some breads. Dry pasta or rice meal packs. Meats and other proteins Fatty cuts of meat. Ribs. Fried meat. Tomasa Blase. Bologna, salami, and other precooked or cured meats, such as sausages or meat loaves, that are not lean and low in  sodium. Fat from the back of a pig (fatback). Bratwurst. Salted nuts and seeds. Canned beans with added salt. Canned or smoked fish. Whole eggs or egg yolks. Chicken or [place] with skin. Dairy Whole or 2% milk, cream, and half-and-half. Whole or full-fat cream cheese. Whole-fat or sweetened yogurt. Full-fat cheese. Nondairy creamers. Whipped toppings. Processed cheese and cheese spreads. Fats and oils Butter. Stick margarine. Lard. Shortening. Ghee. Bacon fat. Tropical oils, such as coconut, palm kernel, or palm oil. Seasonings and condiments Onion salt, garlic salt, seasoned salt, table salt, and sea salt. Worcestershire sauce. Tartar sauce. Barbecue sauce. Teriyaki sauce. Soy sauce, including reduced-sodium soy sauce. Steak sauce. Canned and packaged gravies. Fish sauce. Oyster sauce. Cocktail sauce. Store-bought horseradish. Ketchup. Mustard. Meat flavorings and tenderizers. Bouillon cubes. Hot sauces. Pre-made or packaged marinades. Pre-made or packaged taco seasonings. Relishes. Regular salad dressings. Other foods Salted popcorn and pretzels. The items listed above may not be all the foods and drinks you should avoid. Talk to a dietitian to learn more. Where to find more information National Heart, Lung, and Blood Institute (NHLBI): BuffaloDryCleaner.gl American Heart Association (AHA): heart.org Academy of Nutrition and Dietetics: eatright.org National Kidney Foundation (NKF): kidney.org This information is not intended to  replace advice given to you by your health care provider. Make sure you discuss any questions you have with your health care provider. Document Revised: 10/22/2022 Document Reviewed: 10/22/2022 Elsevier Patient Education  2024 [employer].

## 2024-01-26 NOTE — Assessment & Plan Note (Addendum)
 Elevated BP first noted on 01/18/2024 during dental appointment: 152/102. Home BP readings since then: 135/74 AM, 163/73 AM,152/97 AM, 166/71 PM, 148/85 AM,155/78 PM, 144/77 AM, 132/89 AM,  Asymptomatic. Started Contraception patch since 06/2023 Caffeine- 1cup of coffee a day ALCOHOL consumption once a week, no illicit drug use, no tobacco use. Fhx of HYPERTENSION (mother, aunt, and MGM).  Get ECG, CBC, CMP, and THYROID ECG today: NSR, no indication of LVH, normal ST and T wave, no previous Ecg to compare Start amlodipine 5mg  in PM Advised to Maintain DASH diet F/up in 57month

## 2024-01-26 NOTE — Progress Notes (Signed)
 Established Patient Visit  Patient: Katherine Shaffer   DOB: May 03, 2001   23 y.o. Female  MRN: [id number] Visit Date: 01/26/2024  Subjective:    Chief Complaint  Patient presents with   Hypertension    Started last Wednesday 152/102    Primary hypertension Elevated BP first noted on 01/18/2024 during dental appointment: 152/102. Home BP readings since then: 135/74 AM, 163/73 AM,152/97 AM, 166/71 PM, 148/85 AM,155/78 PM, 144/77 AM, 132/89 AM,  Asymptomatic. Started Contraception patch since 06/2023 Caffeine- 1cup of coffee a day ALCOHOL consumption once a week, no illicit drug use, no tobacco use. Fhx of HYPERTENSION (mother, aunt, and MGM).  Get ECG, CBC, CMP, and THYROID ECG today: NSR, no indication of LVH, normal ST and T wave, no previous Ecg to compare Start amlodipine 5mg  in PM Advised to Maintain DASH diet F/up in 24month  Reviewed medical, surgical, and social history today  Medications: Outpatient Medications Prior to Visit  Medication Sig   XULANE 150-35 MCG/24HR transdermal patch 1 patch once a week.   [DISCONTINUED] Acetaminophen 500 MG capsule Take 2 capsules (1,000 mg total) by mouth every 8 (eight) hours as needed for fever.   [DISCONTINUED] cephALEXin (KEFLEX) 500 MG capsule Take 1 capsule (500 mg total) by mouth 4 (four) times daily.   [DISCONTINUED] diclofenac (VOLTAREN) 75 MG EC tablet Take 1 tablet (75 mg total) by mouth 2 (two) times daily.   [DISCONTINUED] ketoconazole (NIZORAL) 2 % cream Apply 1 Application topically daily.   [DISCONTINUED] metroNIDAZOLE (FLAGYL) 500 MG tablet Take 1 tablet (500 mg total) by mouth 2 (two) times daily.   No facility-administered medications prior to visit.   Reviewed past medical and social history.   ROS per HPI above      Objective:  BP 138/80   Pulse 80   Temp 98.4 F (36.9 C) (Temporal)   Ht 5\' 5"  (1.651 m)   Wt 162 lb 6.4 oz (73.7 kg)   SpO2 99%   BMI 27.02 kg/m      Physical  Exam Vitals and nursing note reviewed.  Neck:     Thyroid: No thyroid mass, thyromegaly or thyroid tenderness.  Cardiovascular:     Rate and Rhythm: Normal rate and regular rhythm.     Pulses: Normal pulses.     Heart sounds: Normal heart sounds.  Pulmonary:     Effort: Pulmonary effort is normal.     Breath sounds: Normal breath sounds.  Musculoskeletal:     Right lower leg: No edema.     Left lower leg: No edema.  Neurological:     Mental Status: She is alert and oriented to person, place, and time.     No results found for any visits on 01/26/24.    Assessment & Plan:    Problem List Items Addressed This Visit     Primary hypertension - Primary   Elevated BP first noted on 01/18/2024 during dental appointment: 152/102. Home BP readings since then: 135/74 AM, 163/73 AM,152/97 AM, 166/71 PM, 148/85 AM,155/78 PM, 144/77 AM, 132/89 AM,  Asymptomatic. Started Contraception patch since 06/2023 Caffeine- 1cup of coffee a day ALCOHOL consumption once a week, no illicit drug use, no tobacco use. Fhx of HYPERTENSION (mother, aunt, and MGM).  Get ECG, CBC, CMP, and THYROID ECG today: NSR, no indication of LVH, normal ST and T wave, no previous Ecg to compare Start amlodipine 5mg  in PM Advised to  Maintain DASH diet F/up in 24month      Relevant Orders   EKG 12-Lead (Completed)   CBC   Comprehensive metabolic panel with GFR   TSH   Return in about 4 weeks (around 02/23/2024) for HTN.     Alysia Penna, NP

## 2024-01-26 NOTE — Telephone Encounter (Signed)
 Copied from CRM [id number]. Topic: Clinical - Medication Refill >> Jan 26, 2024  4:24 PM Ann Held wrote: Most Recent Primary Care Visit:  Provider: Anne Ng  Department: [facility]  Visit Type: ACUTE  Date: 01/26/2024  Medication: amlodipine 5 mg   Has the patient contacted their pharmacy? Yes (Agent: If no, request that the patient contact the pharmacy for the refill. If patient does not wish to contact the pharmacy document the reason why and proceed with request.) (Agent: If yes, when and what did the pharmacy advise?)  Is this the correct pharmacy for this prescription? Yes If no, delete pharmacy and type the correct one.  This is the patient's preferred pharmacy:  [id number] [place], [place] - [address] [place] [address] Phone: [phone] Fax: [phone]  [id number] - [address] [place] - [address] [place] [address] Phone: [phone] Fax: [phone]   Has the prescription been filled recently? No  Is the patient out of the medication? No  Has the patient been seen for an appointment in the last year OR does the patient have an upcoming appointment? Yes  Can we respond through MyChart? Yes  Agent: Please be advised that Rx refills may take up to 3 business days. We ask that you follow-up with your pharmacy.

## 2024-01-27 ENCOUNTER — Ambulatory Visit: Payer: Self-pay | Admitting: Family Medicine

## 2024-01-27 MED ORDER — AMLODIPINE BESYLATE 5 MG PO TABS: 5.0000 mg | ORAL_TABLET | Freq: Every day | ORAL | 0 refills | Status: DC

## 2024-01-27 NOTE — Telephone Encounter (Signed)
 I called and spoke with patient and notified her that Rx was sent in to pharmacy,

## 2024-01-27 NOTE — Telephone Encounter (Signed)
 Requesting: amlodipine 5 mg  Last Visit: 01/26/2024 Next Visit: 03/01/2024 Last Refill: Has not been prescribed but mentioned at appointment yesterday to continue  Please Advise

## 2024-02-18 ENCOUNTER — Other Ambulatory Visit: Payer: Self-pay | Admitting: Nurse Practitioner

## 2024-02-23 ENCOUNTER — Encounter (HOSPITAL_COMMUNITY): Payer: Self-pay

## 2024-03-01 ENCOUNTER — Encounter: Payer: Self-pay | Admitting: Nurse Practitioner

## 2024-03-01 ENCOUNTER — Ambulatory Visit (INDEPENDENT_AMBULATORY_CARE_PROVIDER_SITE_OTHER): Admitting: Nurse Practitioner

## 2024-03-01 VITALS — BP 128/82 | HR 91 | Temp 97.3°F | Ht 65.0 in | Wt 158.6 lb

## 2024-03-01 DIAGNOSIS — I10 Essential (primary) hypertension: Secondary | ICD-10-CM

## 2024-03-01 DIAGNOSIS — Z30011 Encounter for initial prescription of contraceptive pills: Secondary | ICD-10-CM | POA: Insufficient documentation

## 2024-03-01 LAB — POCT URINE PREGNANCY: Preg Test, Ur: NEGATIVE

## 2024-03-01 MED ORDER — LISINOPRIL 10 MG PO TABS: 10.0000 mg | ORAL_TABLET | Freq: Every day | ORAL | 5 refills | Status: DC

## 2024-03-01 MED ORDER — NORETHIN ACE-ETH ESTRAD-FE 1-20 MG-MCG(24) PO TABS: 1.0000 | ORAL_TABLET | Freq: Every day | ORAL | 11 refills | Status: DC

## 2024-03-01 NOTE — Patient Instructions (Addendum)
 Stop amlodipine  Start lisinopril 10mg  Start loestrin contraception on first day of next cycle. You need to use condoms for the next 2months to prevent pregnancy.

## 2024-03-01 NOTE — Assessment & Plan Note (Signed)
 Reports paresthesia in feet with amlodipine  5mg . Symptoms resolved with discontinuation of med x 2days. Reports headache with elevated BP BP Readings from Last 3 Encounters:  03/01/24 128/82  01/26/24 138/80  11/04/22 127/67    Stop amlodipine  Start lisinopril 10mg  daily F/up in 3months

## 2024-03-01 NOTE — Progress Notes (Signed)
 Established Patient Visit  Patient: Katherine Shaffer   DOB: 2000-11-15   22 y.o. Female  MRN: [id number] Visit Date: 03/01/2024  Subjective:     Chief Complaint  Patient presents with   Follow-up    Says she believes the amlodipine  is making her right foot tingle says the same thing happened to her mom   HPI Primary hypertension Reports paresthesia in feet with amlodipine  5mg . Symptoms resolved with discontinuation of med x 2days. Reports headache with elevated BP BP Readings from Last 3 Encounters:  03/01/24 128/82  01/26/24 138/80  11/04/22 127/67    Stop amlodipine  Start lisinopril 10mg  daily F/up in 3months  Encounter for initial prescription of contraceptive pills Stopped use of xulane patch 2months ago due to continuous bleeding with patch x 2months. LMP 02/18/2024, not sexually active x 4months. Request to start COC Urine preg: negative UTD with PAP-report requested from GYN.  Start loestrin Continue safe sex practices  BP Readings from Last 3 Encounters:  03/01/24 128/82  01/26/24 138/80  11/04/22 127/67    Reviewed medical, surgical, and social history today  Medications: Outpatient Medications Prior to Visit  Medication Sig Note   [DISCONTINUED] amLODipine  (NORVASC ) 5 MG tablet TAKE 1 TABLET (5 MG TOTAL) BY MOUTH DAILY. 03/01/2024: paresthesia in feet   [DISCONTINUED] XULANE 150-35 MCG/24HR transdermal patch 1 patch once a week. (Patient not taking: Reported on 03/01/2024)    No facility-administered medications prior to visit.   Reviewed past medical and social history.   ROS per HPI above      Objective:  BP 128/82 (BP Location: Left Arm, Patient Position: Supine, Cuff Size: Small)   Pulse 91   Temp (!) 97.3 F (36.3 C) (Temporal)   Ht 5\' 5"  (1.651 m)   Wt 158 lb 9.6 oz (71.9 kg)   BMI 26.39 kg/m      Physical Exam Vitals and nursing note reviewed.  Cardiovascular:     Rate and Rhythm: Normal rate and regular rhythm.      Pulses: Normal pulses.     Heart sounds: Normal heart sounds.  Pulmonary:     Effort: Pulmonary effort is normal.     Breath sounds: Normal breath sounds.  Neurological:     Mental Status: She is alert and oriented to person, place, and time.     Results for orders placed or performed in visit on 03/01/24  POCT urine pregnancy  Result Value Ref Range   Preg Test, Ur Negative Negative      Assessment & Plan:    Problem List Items Addressed This Visit     Encounter for initial prescription of contraceptive pills   Stopped use of xulane patch 2months ago due to continuous bleeding with patch x 2months. LMP 02/18/2024, not sexually active x 4months. Request to start COC Urine preg: negative UTD with PAP-report requested from GYN.  Start loestrin Continue safe sex practices      Relevant Medications   Norethindrone Acetate-Ethinyl Estrad-FE (LOESTRIN 24 FE) 1-20 MG-MCG(24) tablet   Other Relevant Orders   POCT urine pregnancy (Completed)   Primary hypertension - Primary   Reports paresthesia in feet with amlodipine  5mg . Symptoms resolved with discontinuation of med x 2days. Reports headache with elevated BP BP Readings from Last 3 Encounters:  03/01/24 128/82  01/26/24 138/80  11/04/22 127/67    Stop amlodipine  Start lisinopril 10mg  daily F/up in 3months  Relevant Medications   lisinopril (ZESTRIL) 10 MG tablet   Return in about 3 months (around 06/01/2024) for HTN and repeat PAP.     Kathrene Parents, NP

## 2024-03-01 NOTE — Assessment & Plan Note (Addendum)
 Stopped use of xulane patch 2months ago due to continuous bleeding with patch x 2months. LMP 02/18/2024, not sexually active x 4months. Request to start COC Urine preg: negative UTD with PAP-report requested from GYN.  Start loestrin Continue safe sex practices

## 2024-08-29 ENCOUNTER — Ambulatory Visit: Attending: Obstetrics | Admitting: Physical Therapy

## 2024-08-29 ENCOUNTER — Other Ambulatory Visit: Payer: Self-pay

## 2024-08-29 DIAGNOSIS — R279 Unspecified lack of coordination: Secondary | ICD-10-CM | POA: Insufficient documentation

## 2024-08-29 DIAGNOSIS — M6281 Muscle weakness (generalized): Secondary | ICD-10-CM | POA: Insufficient documentation

## 2024-08-29 DIAGNOSIS — R293 Abnormal posture: Secondary | ICD-10-CM | POA: Insufficient documentation

## 2024-08-29 DIAGNOSIS — M62838 Other muscle spasm: Secondary | ICD-10-CM | POA: Diagnosis present

## 2024-08-29 NOTE — Therapy (Unsigned)
 OUTPATIENT PHYSICAL THERAPY FEMALE PELVIC EVALUATION   Patient Name: Katherine Shaffer MRN: [id number] DOB:02/15/2001, 23 y.o., female Today's Date: 08/30/2024  END OF SESSION:  PT End of Session - 08/29/24 1543     Visit Number 1    Date for Recertification  12/17/24    Authorization Type [employer]    PT Start Time 1542   arrival times   PT Stop Time 1615    PT Time Calculation (min) 33 min    Activity Tolerance Patient tolerated treatment well;No increased pain    Behavior During Therapy WFL for tasks assessed/performed          Past Medical History:  Diagnosis Date   Heart murmur    echo 2016   Past Surgical History:  Procedure Laterality Date   cyst on lip removed     DILATION AND EVACUATION N/A 07/17/2022   Procedure: DILATATION AND EVACUATION;  Surgeon: Izell Harari, MD;  Location: [facility];  Service: Gynecology;  Laterality: N/A;   OPERATIVE ULTRASOUND N/A 07/17/2022   Procedure: OPERATIVE ULTRASOUND;  Surgeon: Izell Harari, MD;  Location: [facility];  Service: Gynecology;  Laterality: N/A;   WISDOM TOOTH EXTRACTION     Patient Active Problem List   Diagnosis Date Noted   Encounter for initial prescription of contraceptive pills 03/01/2024   Primary hypertension 01/26/2024   Psoriasis 09/18/2022   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    PCP: Katheen Roselie Rockford, NP   REFERRING PROVIDER: Kandyce Sor, MD  REFERRING DIAG: O26.899 (ICD-10-CM) - Other specified pregnancy related conditions, unspecified trimester R10.2 (ICD-10-CM) - Pelvic and perineal pain  THERAPY DIAG:  Unspecified lack of coordination  Other muscle spasm  Abnormal posture  Muscle weakness (generalized)  Rationale for Evaluation and Treatment: Rehabilitation  ONSET DATE: first trimester worsening since then   SUBJECTIVE:                                                                                                                                                                                            SUBJECTIVE STATEMENT: Pelvic pain with first pregnancy, reports high BP, due in March 2026. 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 Stairs, walking more than 10 mins but again worse at end of day, getting into/out of car, any single leg movements.   Pt reports she is taking a heart burn medication, something for nausea, and baby aspirin and iron supplement   Fluid intake:   FUNCTIONAL LIMITATIONS: Stairs, walking more than 10 mins but again worse at end of day, getting into/out of  car, any single leg movements.   PERTINENT HISTORY:  Medications for current condition: no Surgeries: no Other: no Sexual abuse: No  PAIN:  Are you having pain? Yes NPRS scale: 2-7/10 morning is painful rolling or to get out of bed but then worsens as day goes on Pain location: anterior pelvis and groin   Pain type: aching and soreness - sharp shooting with triggers  Pain description: constant   Aggravating factors: worse at end of day, getting into/out of car, any single leg movements, rolling and bed transfers .  Relieving factors: rest and heat  PRECAUTIONS: Other: pregnancy   RED FLAGS: None   WEIGHT BEARING RESTRICTIONS: No  FALLS:  Has patient fallen in last 6 months? No  OCCUPATION: teacher  ACTIVITY LEVEL : a lot of walking at work   PLOF: Independent  PATIENT GOALS: to have less pain    BOWEL MOVEMENT: Pain with bowel movement: No Type of bowel movement:Type (Bristol Stool Scale) 3, Frequency 1x weekly with iron supplement , and Strain no Fully empty rectum: No Leakage: No                                                  Caused by:  Bowel urgency: n Pads: No Fiber supplement/laxative No  URINATION: Pain with urination: No Fully empty bladder: Yes:                                           Post-void dribble: No Stream: Strong Urgency: Yes  Frequency:during the day pregnancy  related increase d                                                        Nocturia: Yes:  1-2x   Leakage: Coughing and Laughing Pads/briefs: No  INTERCOURSE:  Ability to have vaginal penetration Yes  Pain with intercourse: none Dryness: No Climax: yes Marinoff Scale: 0/3 Lubricant:na Low desire   PREGNANCY: Vaginal deliveries 0  C-section deliveries 0 Currently pregnant Yes: 22w  PROLAPSE: None   OBJECTIVE:  Note: Objective measures were completed at Evaluation unless otherwise noted.  DIAGNOSTIC FINDINGS:    COGNITION: Overall cognitive status: Within functional limits for tasks assessed     SENSATION: Light touch: Appears intact  LUMBAR SPECIAL TESTS:  Single leg stance test: pain bil at anterior pelvis and SI Compression/distraction test: improved pain with compression   FUNCTIONAL TESTS:   Sit-up test: Squat: Bed mobility: crunch to sit and noted abdominal bulge and reports worse pain  GAIT: Assistive device utilized: None Comments: decreased cadence, decreased bil step length   POSTURE: rounded shoulders, increased thoracic kyphosis, and anterior pelvic tilt   LUMBARAROM/PROM:  A/PROM A/PROM  Eval (% available)  Flexion Pregnant but full   Extension Full   Right lateral flexion Limited by 25%  Left lateral flexion Limited by 25%  Right rotation Limited by 25%  Left rotation Limited by 25%   (Blank rows = not tested)  LOWER EXTREMITY ROM:  Bil hamstrings and adductors limited by 25%  LOWER EXTREMITY MMT:  Bil hips  grossly 3+/5 with pain at all hip MMTs at anterior pelvis Knees 5/5 no pain PALPATION:  General: tightness in lumbar paraspinals   Pelvic Alignment: WFL  Abdominal:   Diastasis: pregnant  Distortion: Yes  with attempt to crunch from supine on mat table Breathing: chest Scar tissue: no Active Straight Leg Raise: pain and abdominal bulge                 External Perineal Exam: deferred                               Internal Pelvic Floor: deferred   Patient confirms identification and approves PT to assess internal pelvic floor and treatment No All internal or external pelvic floor assessments and/or treatments are completed with proper hand hygiene and gloves hands. If needed gloves are changed with hand hygiene during patient care time.  PELVIC MMT:   MMT eval  Vaginal   Internal Anal Sphincter   External Anal Sphincter   Puborectalis   (Blank rows = not tested)        TONE: Deferred   PROLAPSE: Deferred   TODAY'S TREATMENT:                                                                                                                              DATE:   08/29/24 EVAL Examination completed, findings reviewed, pt educated on POC, HEP, and manual for taping at abdomen for decreased pain and improved support with abdomen. X2 vertical strips either side of umbilicus and x2 lateral toward bra line, all anchored at lower abdomen midline with 25% upright stretch. Pt denied allergies to adhesives and educated on how and when to remove and to remove with any reactions. Pt also educated on log rolling and hip adduction to decreased strain at anterior middle pelvis and pt reported decreased pain with these techniques. Pt also reports good relief with tape once in standing.  Pt motivated to participate in PT and agreeable to attempt recommendations.     PATIENT EDUCATION:  Education details: [employer] Person educated: Patient Education method: Solicitor, Actor cues, Verbal cues, and Handouts Education comprehension: verbalized understanding, returned demonstration, verbal cues required, tactile cues required, and needs further education  HOME EXERCISE PROGRAM: [id number]  ASSESSMENT:  CLINICAL IMPRESSION: Patient is a 23 y.o. female  who was seen today for physical therapy evaluation and treatment for pain at pelvis with pregnancy. Pt reports 22w pregnant and started having anterior  pelvic pain in first trimester, worse with all single leg movement and reciprocal mobility and has been worsening since then. Now pain worse at end of day but starts early with triggers. Pt demonstrated impaired posture, abdominal distortion with bed mobility at mat, and decreased core and hip strength, impaired load transfer and stability and pain with single leg stance bil. Pt also had replicated pain with all MMTs of hips. Pt educated  on techniques to decreased pain with her triggers, log rolling, breathing and transverse abdominis activation activations as able, HEP given, and tape at abdomen placed for decreased pain. All reported to have improved pain during session by pt. Pt would benefit from additional PT to further address deficits.    OBJECTIVE IMPAIRMENTS: decreased activity tolerance, decreased coordination, decreased endurance, decreased mobility, difficulty walking, decreased strength, impaired perceived functional ability, increased muscle spasms, impaired flexibility, improper body mechanics, postural dysfunction, and pain.   ACTIVITY LIMITATIONS: standing, stairs, bed mobility, continence, and locomotion level  PARTICIPATION LIMITATIONS: community activity, occupation, and yard work  PERSONAL FACTORS: Fitness, Time since onset of injury/illness/exacerbation, and 1 comorbidity: pregnancy  are also affecting patient's functional outcome.   REHAB POTENTIAL: Good  CLINICAL DECISION MAKING: Stable/uncomplicated  EVALUATION COMPLEXITY: Low   GOALS: Goals reviewed with patient? Yes  SHORT TERM GOALS: Target date: 09/26/24  Pt to be I with HEP for carry over and continuing recommendations for improved outcomes.   Baseline: Goal status: INITIAL  2.  Pt to be I with compensatory strategies to decrease anterior pubic pain and better tolerate walking for her full work day.  Baseline:  Goal status: INITIAL   LONG TERM GOALS: Target date: 12/31/24  Pt to be I with advanced  HEP for  carry over and continuing recommendations for improved outcomes.   Baseline:  Goal status: INITIAL  2.  Pt to demonstrate improved upright midline posture for at 30 mins to decreased strain at pelvic floor and improve tolerance to prolonged standing and walking for 30 mins Baseline:  Goal status: INITIAL  3.  Pt to demonstrate at least 5/5 bil hip strength for improved pelvic stability for birth preparation and postpartum demands.  Baseline:  Goal status: INITIAL  4.  Pt to report no more than 2/10 pain with end of day household chores.  Baseline:  Goal status: INITIAL  5.  Pt to demonstrate consistent log rolling techniques for decreased strain at linea alba and improve mechanics for bed mobility. Baseline:  Goal status: INITIAL   PLAN:  PT FREQUENCY: 1-2x/week  PT DURATION: 16 sessions  PLANNED INTERVENTIONS: 97110-Therapeutic exercises, 97530- Therapeutic activity, 97112- Neuromuscular re-education, 97535- Self Care, 02859- Manual therapy, [phone]- Aquatic Therapy, [phone] (1-2 muscles), 20561 (3+ muscles)- Dry Needling, Patient/Family education, Taping, Joint mobilization, Spinal mobilization, Scar mobilization, DME instructions, Cryotherapy, Moist heat, and Biofeedback  PLAN FOR NEXT SESSION: stretching bil hips, diaphragmatic breathing, pelvic mobility, posture strengthening Educating on compensation for pubic symphysis/round ligament type pain    Darryle Navy, PT, DPT 11/12/258:47 AM  [facility] [address], [address] [place], [place] [address] Phone # [phone] Fax [phone]

## 2024-09-28 ENCOUNTER — Ambulatory Visit: Attending: Obstetrics | Admitting: Physical Therapy

## 2024-09-28 DIAGNOSIS — M62838 Other muscle spasm: Secondary | ICD-10-CM | POA: Diagnosis present

## 2024-09-28 DIAGNOSIS — M6281 Muscle weakness (generalized): Secondary | ICD-10-CM

## 2024-09-28 DIAGNOSIS — R293 Abnormal posture: Secondary | ICD-10-CM

## 2024-09-28 DIAGNOSIS — R279 Unspecified lack of coordination: Secondary | ICD-10-CM | POA: Insufficient documentation

## 2024-09-28 NOTE — Therapy (Signed)
 OUTPATIENT PHYSICAL THERAPY FEMALE PELVIC TREATMENT   Patient Name: Katherine Shaffer MRN: [id number] DOB:May 26, 2001, 23 y.o., female Today's Date: 09/28/2024  END OF SESSION:  PT End of Session - 09/28/24 1530     Visit Number 2    Date for Recertification  12/17/24    Authorization Type [employer]    PT Start Time 1530    PT Stop Time 1610    PT Time Calculation (min) 40 min    Activity Tolerance Patient tolerated treatment well;No increased pain    Behavior During Therapy WFL for tasks assessed/performed          Past Medical History:  Diagnosis Date   Heart murmur    echo 2016   Past Surgical History:  Procedure Laterality Date   cyst on lip removed     DILATION AND EVACUATION N/A 07/17/2022   Procedure: DILATATION AND EVACUATION;  Surgeon: Izell Harari, MD;  Location: [facility];  Service: Gynecology;  Laterality: N/A;   OPERATIVE ULTRASOUND N/A 07/17/2022   Procedure: OPERATIVE ULTRASOUND;  Surgeon: Izell Harari, MD;  Location: [facility];  Service: Gynecology;  Laterality: N/A;   WISDOM TOOTH EXTRACTION     Patient Active Problem List   Diagnosis Date Noted   Encounter for initial prescription of contraceptive pills 03/01/2024   Primary hypertension 01/26/2024   Psoriasis 09/18/2022   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    PCP: Katheen Roselie Rockford, NP   REFERRING PROVIDER: Kandyce Sor, MD  REFERRING DIAG: O26.899 (ICD-10-CM) - Other specified pregnancy related conditions, unspecified trimester R10.2 (ICD-10-CM) - Pelvic and perineal pain  THERAPY DIAG:  Unspecified lack of coordination  Other muscle spasm  Abnormal posture  Muscle weakness (generalized)  Rationale for Evaluation and Treatment: Rehabilitation  ONSET DATE: first trimester worsening since then   SUBJECTIVE:                                                                                                                                                                                            SUBJECTIVE STATEMENT: Pt reports pain is more at vagina now, sitting is worse. Taping really helped lower the pain  Fluid intake:   FUNCTIONAL LIMITATIONS: Stairs, walking more than 10 mins but again worse at end of day, getting into/out of car, any single leg movements.   PERTINENT HISTORY:  Medications for current condition: no Surgeries: no Other: no Sexual abuse: No  PAIN:  Are you having pain? Yes NPRS scale: 7/10 morning is painful rolling or to get out of bed but then worsens as day goes on Pain location: vaginal   Pain type: aching and  soreness - sharp shooting with triggers  Pain description: constant   Aggravating factors: worse at end of day, getting into/out of car, any single leg movements, rolling and bed transfers .  Relieving factors: heat, lying down is only relief  PRECAUTIONS: Other: pregnancy   RED FLAGS: None   WEIGHT BEARING RESTRICTIONS: No  FALLS:  Has patient fallen in last 6 months? No  OCCUPATION: teacher  ACTIVITY LEVEL : a lot of walking at work   PLOF: Independent  PATIENT GOALS: to have less pain    BOWEL MOVEMENT: Pain with bowel movement: No Type of bowel movement:Type (Bristol Stool Scale) 3, Frequency 1x weekly with iron supplement , and Strain no Fully empty rectum: No Leakage: No                                                  Caused by:  Bowel urgency: n Pads: No Fiber supplement/laxative No  URINATION: Pain with urination: No Fully empty bladder: Yes:                                           Post-void dribble: No Stream: Strong Urgency: Yes  Frequency:during the day pregnancy related increase d                                                        Nocturia: Yes:  1-2x   Leakage: Coughing and Laughing Pads/briefs: No  INTERCOURSE:  Ability to have vaginal penetration Yes  Pain with intercourse: none Dryness: No Climax: yes Marinoff Scale: 0/3 Lubricant:na Low desire    PREGNANCY: Vaginal deliveries 0  C-section deliveries 0 Currently pregnant Yes: 22w  PROLAPSE: None   OBJECTIVE:  Note: Objective measures were completed at Evaluation unless otherwise noted.  DIAGNOSTIC FINDINGS:    COGNITION: Overall cognitive status: Within functional limits for tasks assessed     SENSATION: Light touch: Appears intact  LUMBAR SPECIAL TESTS:  Single leg stance test: pain bil at anterior pelvis and SI Compression/distraction test: improved pain with compression   FUNCTIONAL TESTS:   Sit-up test: Squat: Bed mobility: crunch to sit and noted abdominal bulge and reports worse pain  GAIT: Assistive device utilized: None Comments: decreased cadence, decreased bil step length   POSTURE: rounded shoulders, increased thoracic kyphosis, and anterior pelvic tilt   LUMBARAROM/PROM:  A/PROM A/PROM  Eval (% available)  Flexion Pregnant but full   Extension Full   Right lateral flexion Limited by 25%  Left lateral flexion Limited by 25%  Right rotation Limited by 25%  Left rotation Limited by 25%   (Blank rows = not tested)  LOWER EXTREMITY ROM:  Bil hamstrings and adductors limited by 25%  LOWER EXTREMITY MMT:  Bil hips grossly 3+/5 with pain at all hip MMTs at anterior pelvis Knees 5/5 no pain PALPATION:  General: tightness in lumbar paraspinals   Pelvic Alignment: WFL  Abdominal:   Diastasis: pregnant  Distortion: Yes  with attempt to crunch from supine on mat table Breathing: chest Scar tissue: no Active Straight Leg Raise: pain and abdominal bulge  External Perineal Exam: deferred                              Internal Pelvic Floor: deferred   Patient confirms identification and approves PT to assess internal pelvic floor and treatment No All internal or external pelvic floor assessments and/or treatments are completed with proper hand hygiene and gloves hands. If needed gloves are changed with hand hygiene during  patient care time.  PELVIC MMT:   MMT eval  Vaginal   Internal Anal Sphincter   External Anal Sphincter   Puborectalis   (Blank rows = not tested)        TONE: Deferred   PROLAPSE: Deferred   TODAY'S TREATMENT:                                                                                                                              DATE:   08/29/24 EVAL Examination completed, findings reviewed, pt educated on POC, HEP, and manual for taping at abdomen for decreased pain and improved support with abdomen. X2 vertical strips either side of umbilicus and x2 lateral toward bra line, all anchored at lower abdomen midline with 25% upright stretch. Pt denied allergies to adhesives and educated on how and when to remove and to remove with any reactions. Pt also educated on log rolling and hip adduction to decreased strain at anterior middle pelvis and pt reported decreased pain with these techniques. Pt also reports good relief with tape once in standing.  Pt motivated to participate in PT and agreeable to attempt recommendations.    09/28/24: X2 vertical strips either side of umbilicus and x2 each side lateral toward bra line, all anchored at lower abdomen midline with 25% upright stretch. Elevated lunge stretch (belly toward knee) 2x30s each Pec stretch 3x30s each side in door way (reports she uses her ball often at home to sit one and updated HEP for ball version of this stretch) Hip shift x10 each +30s each Seated reverse clam x10 each Pelvic tilts x10 ant/post and rt/lt Pt educated on maintain stretching and pelvic mobility with ball, can attempt sitting with towel roll in chair seat if unable to use ball at work for comfort.  Leg elevation at end of day if pressure continues at vaginal area  PATIENT EDUCATION:  Education details: [facility] Person educated: Patient Education method: Explanation, Demonstration, Tactile cues, Verbal cues, and Handouts Education comprehension:  verbalized understanding, returned demonstration, verbal cues required, tactile cues required, and needs further education  HOME EXERCISE PROGRAM: [id number]  ASSESSMENT:  CLINICAL IMPRESSION: Patient is a 23 y.o. female  who was seen today for physical therapy treatment for pain at pelvis with pregnancy. Pt reports pain still present but more vaginal now reports pressure/pull on pelvis worse with belly size. Tape is helpful and greatly decreased pressure and heaviness felt. Pt demonstrates rounded posture and tension in bil hips and Pt  would benefit from additional PT to further address deficits.    OBJECTIVE IMPAIRMENTS: decreased activity tolerance, decreased coordination, decreased endurance, decreased mobility, difficulty walking, decreased strength, impaired perceived functional ability, increased muscle spasms, impaired flexibility, improper body mechanics, postural dysfunction, and pain.   ACTIVITY LIMITATIONS: standing, stairs, bed mobility, continence, and locomotion level  PARTICIPATION LIMITATIONS: community activity, occupation, and yard work  PERSONAL FACTORS: Fitness, Time since onset of injury/illness/exacerbation, and 1 comorbidity: pregnancy  are also affecting patient's functional outcome.   REHAB POTENTIAL: Good  CLINICAL DECISION MAKING: Stable/uncomplicated  EVALUATION COMPLEXITY: Low   GOALS: Goals reviewed with patient? Yes  SHORT TERM GOALS: Target date: 09/26/24  Pt to be I with HEP for carry over and continuing recommendations for improved outcomes.   Baseline: Goal status: MET 09/28/24  2.  Pt to be I with compensatory strategies to decrease anterior pubic pain and better tolerate walking for her full work day.  Baseline:  Goal status: MET 09/28/24   LONG TERM GOALS: Target date: 12/31/24  Pt to be I with advanced  HEP for carry over and continuing recommendations for improved outcomes.   Baseline:  Goal status: INITIAL  2.  Pt to demonstrate  improved upright midline posture for at 30 mins to decreased strain at pelvic floor and improve tolerance to prolonged standing and walking for 30 mins Baseline:  Goal status: INITIAL  3.  Pt to demonstrate at least 5/5 bil hip strength for improved pelvic stability for birth preparation and postpartum demands.  Baseline:  Goal status: INITIAL  4.  Pt to report no more than 2/10 pain with end of day household chores.  Baseline:  Goal status: INITIAL  5.  Pt to demonstrate consistent log rolling techniques for decreased strain at linea alba and improve mechanics for bed mobility. Baseline:  Goal status: INITIAL   PLAN:  PT FREQUENCY: 1-2x/week  PT DURATION: 16 sessions  PLANNED INTERVENTIONS: 97110-Therapeutic exercises, 97530- Therapeutic activity, 97112- Neuromuscular re-education, 97535- Self Care, 02859- Manual therapy, [phone]- Aquatic Therapy, [phone] (1-2 muscles), 20561 (3+ muscles)- Dry Needling, Patient/Family education, Taping, Joint mobilization, Spinal mobilization, Scar mobilization, DME instructions, Cryotherapy, Moist heat, and Biofeedback  PLAN FOR NEXT SESSION: stretching bil hips, diaphragmatic breathing, pelvic mobility, posture strengthening Educating on compensation for pubic symphysis/round ligament type pain    Darryle Navy, PT, DPT 12/11/20257:25 PM  [facility] [address], [address] [place], [place] [address] Phone # [phone] Fax [phone]

## 2024-10-10 ENCOUNTER — Ambulatory Visit: Admitting: Physical Therapy

## 2024-11-19 ENCOUNTER — Other Ambulatory Visit: Payer: Self-pay

## 2024-11-19 ENCOUNTER — Inpatient Hospital Stay (HOSPITAL_COMMUNITY)
Admission: AD | Admit: 2024-11-19 | Discharge: 2024-11-19 | Disposition: A | Discharge status: Home / Self Care | Attending: Obstetrics | Admitting: Obstetrics

## 2024-11-19 ENCOUNTER — Encounter (HOSPITAL_COMMUNITY): Payer: Self-pay | Admitting: Obstetrics and Gynecology

## 2024-11-19 DIAGNOSIS — Z3A34 34 weeks gestation of pregnancy: Secondary | ICD-10-CM | POA: Diagnosis not present

## 2024-11-19 DIAGNOSIS — O368130 Decreased fetal movements, third trimester, not applicable or unspecified: Secondary | ICD-10-CM | POA: Insufficient documentation

## 2024-11-19 DIAGNOSIS — Z0371 Encounter for suspected problem with amniotic cavity and membrane ruled out: Secondary | ICD-10-CM

## 2024-11-19 DIAGNOSIS — O4703 False labor before 37 completed weeks of gestation, third trimester: Secondary | ICD-10-CM | POA: Diagnosis present

## 2024-11-19 DIAGNOSIS — Z3689 Encounter for other specified antenatal screening: Secondary | ICD-10-CM | POA: Diagnosis not present

## 2024-11-19 HISTORY — DX: Essential (primary) hypertension: I10

## 2024-11-19 LAB — URINALYSIS, ROUTINE W REFLEX MICROSCOPIC
Bilirubin Urine: NEGATIVE
Glucose, UA: NEGATIVE mg/dL
Hgb urine dipstick: NEGATIVE
Ketones, ur: NEGATIVE mg/dL
Leukocytes,Ua: NEGATIVE
Nitrite: NEGATIVE
Protein, ur: NEGATIVE mg/dL
Specific Gravity, Urine: 1.012 (ref 1.005–1.030)
pH: 6 (ref 5.0–8.0)

## 2024-11-19 LAB — POCT FERN TEST: POCT Fern Test: NEGATIVE

## 2024-11-19 NOTE — MAU Provider Note (Signed)
 " History     CSN: [id number]  Arrival date and time: 11/19/24 1414   Event Date/Time   First Provider Initiated Contact with Patient 11/19/24 1514      Chief Complaint  Patient presents with   Decreased Fetal Movement   HPI Katherine Shaffer is a 24 y.o. G2P0010 at [redacted]w[redacted]d who presents for decreased fetal movement. Noticed less movement since this morning. Reports from 930 am to arrival she felt 7 movements. Denies contractions or vaginal bleeding. Woke up with wet underwear last night & unsure if water broke. No leaking since then.   OB History     Gravida  2   Para      Term      Preterm      AB  1   Living         SAB  1   IAB      Ectopic      Multiple      Live Births              Past Medical History:  Diagnosis Date   Heart murmur    echo 2016   Hypertension     Past Surgical History:  Procedure Laterality Date   cyst on lip removed     DILATION AND EVACUATION N/A 07/17/2022   Procedure: DILATATION AND EVACUATION;  Surgeon: Izell Harari, MD;  Location: [facility] OR;  Service: Gynecology;  Laterality: N/A;   OPERATIVE ULTRASOUND N/A 07/17/2022   Procedure: OPERATIVE ULTRASOUND;  Surgeon: Izell Harari, MD;  Location: [facility] OR;  Service: Gynecology;  Laterality: N/A;   WISDOM TOOTH EXTRACTION      Family History  Problem Relation Age of Onset   Hypertension Mother 18       gestational HTN   Glaucoma Mother    Hypertension Maternal Aunt    Hypertension Maternal Grandmother    Diabetes Maternal Grandmother    Hypertension Maternal Grandfather    Stroke Maternal Grandfather 56   Dementia Maternal Grandfather    Glaucoma Paternal Grandmother     Social History[1]  Allergies: Allergies[2]  No medications prior to admission.    Review of Systems  All other systems reviewed and are negative.  Physical Exam   Blood pressure (!) 123/53, pulse 79, temperature 98.1 F (36.7 C), temperature source Oral, resp. rate 18, height 5' 5  (1.651 m), weight 91.9 kg, SpO2 99%.  Physical Exam Vitals and nursing note reviewed. Exam conducted with a chaperone present.  Constitutional:      General: She is not in acute distress.    Appearance: Normal appearance. She is not ill-appearing.  HENT:     Head: Normocephalic and atraumatic.  Eyes:     General: No scleral icterus.    Pupils: Pupils are equal, round, and reactive to light.  Pulmonary:     Effort: Pulmonary effort is normal. No respiratory distress.  Abdominal:     Tenderness: There is no abdominal tenderness.     Comments: gravid  Genitourinary:    Comments: SSE: No pooling of blood. Cervix visually closed. No abnormal discharge.  Skin:    General: Skin is warm and dry.  Neurological:     Mental Status: She is alert.     NST:  Baseline: 140 bpm, Variability: Good {> 6 bpm), Accelerations: Reactive, and Decelerations: Absent  MAU Course  Procedures Results for orders placed or performed during the hospital encounter of 11/19/24 (from the past 24 hours)  Urinalysis, Routine w  reflex microscopic -Urine, Clean Catch     Status: Abnormal   Collection Time: 11/19/24  3:20 PM  Result Value Ref Range   Color, Urine YELLOW YELLOW   APPearance HAZY (A) CLEAR   Specific Gravity, Urine 1.012 1.005 - 1.030   pH 6.0 5.0 - 8.0   Glucose, UA NEGATIVE NEGATIVE mg/dL   Hgb urine dipstick NEGATIVE NEGATIVE   Bilirubin Urine NEGATIVE NEGATIVE   Ketones, ur NEGATIVE NEGATIVE mg/dL   Protein, ur NEGATIVE NEGATIVE mg/dL   Nitrite NEGATIVE NEGATIVE   Leukocytes,Ua NEGATIVE NEGATIVE  POCT fern test     Status: None   Collection Time: 11/19/24  4:08 PM  Result Value Ref Range   POCT Fern Test Negative = intact amniotic membranes    No results found.  MDM   Assessment and Plan   1. Decreased fetal movements in third trimester, single or unspecified fetus  -Movement heard through monitor & documented by patient. Reactive NST -Reviewed kick counts  2. Encounter for  suspected PROM, with rupture of membranes not found  -SSE performed. No pooling & negative fern -Informal BSUS, AFI 14 -Reviewed reasons to return to MAU  3. NST (non-stress test) reactive   4. [redacted] weeks gestation of pregnancy      Rocky Satterfield 11/19/2024, 4:44 PM      [1]  Social History Tobacco Use   Smoking status: Never   Smokeless tobacco: Never  Vaping Use   Vaping status: Never Used  Substance Use Topics   Alcohol use: Not Currently    Comment: occassional 1x/week drink   Drug use: No  [2] No Known Allergies  "

## 2024-11-19 NOTE — MAU Note (Signed)
 Katherine Shaffer is a 24 y.o. at [redacted]w[redacted]d here in MAU reporting: had braxton hicks last night and one time event of fluid last night but has had no leaking today.  States she is feeling some movements but not as much as usual. Reports 7 movements total since 0930. Denies any LOF or VB. Feels tightening in her abdomen at random times but no consistent ctxs. Denies any N/V/D. States she has eaten today and been drinking a lot of fluids.  Onset of complaint: today  Pain score: 0 Vitals:   11/19/24 1426  BP: 138/60  Pulse: 93  Resp: 16  Temp: 98.1 F (36.7 C)  SpO2: 97%     FHT:158 Lab orders placed from triage:  NST

## 2024-12-17 ENCOUNTER — Encounter (HOSPITAL_COMMUNITY)

## 2024-12-31 ENCOUNTER — Inpatient Hospital Stay (HOSPITAL_COMMUNITY): Admit: 2024-12-31
